# Patient Record
Sex: Male | Born: 1980 | Race: Black or African American | Hispanic: No | Marital: Married | Smoking: Current every day smoker
Health system: Southern US, Community
[De-identification: ages and names within clinical notes are randomized; demographics above are authoritative.]

## PROBLEM LIST (undated history)

## (undated) ENCOUNTER — Ambulatory Visit: Admission: EM | Payer: Managed Care, Other (non HMO) | Source: Home / Self Care

## (undated) DIAGNOSIS — I10 Essential (primary) hypertension: Secondary | ICD-10-CM

## (undated) DIAGNOSIS — E785 Hyperlipidemia, unspecified: Secondary | ICD-10-CM

---

## 2001-02-22 ENCOUNTER — Emergency Department (HOSPITAL_COMMUNITY): Admission: EM | Admit: 2001-02-22 | Discharge: 2001-02-23 | Payer: Self-pay | Admitting: Emergency Medicine

## 2001-03-16 ENCOUNTER — Emergency Department (HOSPITAL_COMMUNITY): Admission: EM | Admit: 2001-03-16 | Discharge: 2001-03-16 | Payer: Self-pay | Admitting: Emergency Medicine

## 2001-04-30 ENCOUNTER — Emergency Department (HOSPITAL_COMMUNITY): Admission: EM | Admit: 2001-04-30 | Discharge: 2001-05-01 | Payer: Self-pay | Admitting: Emergency Medicine

## 2001-05-15 ENCOUNTER — Emergency Department (HOSPITAL_COMMUNITY): Admission: EM | Admit: 2001-05-15 | Discharge: 2001-05-15 | Payer: Self-pay

## 2007-07-20 ENCOUNTER — Emergency Department (HOSPITAL_COMMUNITY): Admission: EM | Admit: 2007-07-20 | Discharge: 2007-07-20 | Payer: Self-pay | Admitting: Emergency Medicine

## 2008-06-23 ENCOUNTER — Emergency Department (HOSPITAL_COMMUNITY): Admission: EM | Admit: 2008-06-23 | Discharge: 2008-06-24 | Payer: Self-pay | Admitting: Emergency Medicine

## 2010-05-04 LAB — DIFFERENTIAL
Eosinophils Absolute: 0.2 10*3/uL (ref 0.0–0.7)
Lymphocytes Relative: 28 % (ref 12–46)
Lymphs Abs: 1.8 10*3/uL (ref 0.7–4.0)
Neutro Abs: 4 10*3/uL (ref 1.7–7.7)
Neutrophils Relative %: 62 % (ref 43–77)

## 2010-05-04 LAB — CBC
Platelets: 237 10*3/uL (ref 150–400)
RBC: 5.16 MIL/uL (ref 4.22–5.81)
WBC: 6.5 10*3/uL (ref 4.0–10.5)

## 2010-05-04 LAB — BASIC METABOLIC PANEL
BUN: 15 mg/dL (ref 6–23)
Creatinine, Ser: 1.35 mg/dL (ref 0.4–1.5)
GFR calc Af Amer: >60 mL/min (ref >60)
GFR calc non Af Amer: >60 mL/min (ref >60)

## 2010-10-21 LAB — BASIC METABOLIC PANEL
CO2: 27
Calcium: 9.9
Chloride: 109
GFR calc Af Amer: >60
Potassium: 4.1
Sodium: 141

## 2015-01-26 ENCOUNTER — Emergency Department (HOSPITAL_COMMUNITY)
Admission: EM | Admit: 2015-01-26 | Discharge: 2015-01-26 | Disposition: A | Discharge status: Home / Self Care | Payer: BLUE CROSS/BLUE SHIELD | Attending: Emergency Medicine | Admitting: Emergency Medicine

## 2015-01-26 ENCOUNTER — Encounter (HOSPITAL_COMMUNITY): Payer: Self-pay

## 2015-01-26 DIAGNOSIS — F17200 Nicotine dependence, unspecified, uncomplicated: Secondary | ICD-10-CM | POA: Diagnosis not present

## 2015-01-26 DIAGNOSIS — Z8639 Personal history of other endocrine, nutritional and metabolic disease: Secondary | ICD-10-CM | POA: Insufficient documentation

## 2015-01-26 DIAGNOSIS — K0889 Other specified disorders of teeth and supporting structures: Secondary | ICD-10-CM | POA: Insufficient documentation

## 2015-01-26 DIAGNOSIS — I10 Essential (primary) hypertension: Secondary | ICD-10-CM | POA: Insufficient documentation

## 2015-01-26 HISTORY — DX: Essential (primary) hypertension: I10

## 2015-01-26 HISTORY — DX: Hyperlipidemia, unspecified: E78.5

## 2015-01-26 MED ORDER — PENICILLIN V POTASSIUM 500 MG PO TABS: 500.0000 mg | ORAL_TABLET | Freq: Four times a day (QID) | ORAL | Status: AC

## 2015-01-26 MED ORDER — IBUPROFEN 800 MG PO TABS
800.0000 mg | ORAL_TABLET | Freq: Once | ORAL | Status: AC
  Administered 2015-01-26: 800 mg via ORAL
  Filled 2015-01-26: qty 1

## 2015-01-26 MED ORDER — PENICILLIN V POTASSIUM 250 MG PO TABS
500.0000 mg | ORAL_TABLET | Freq: Once | ORAL | Status: AC
  Administered 2015-01-26: 500 mg via ORAL
  Filled 2015-01-26: qty 2

## 2015-01-26 NOTE — ED Notes (Signed)
Pt states understanding of care given and follow up instructions 

## 2015-01-26 NOTE — ED Provider Notes (Signed)
CSN: [id number]     Arrival date & time 01/26/15  0049 History   First MD Initiated Contact with Patient 01/26/15 0059     Chief Complaint  Patient presents with  . Dental Pain     Patient is a 35 y.o. male presenting with tooth pain. The history is provided by the patient.  Dental Pain Location:  Lower Quality:  Dull Severity:  Moderate Duration:  3 days Timing:  Intermittent Progression:  Worsening Chronicity:  New Relieved by:  Nothing Worsened by:  Jaw movement Associated symptoms: no facial swelling and no fever     Past Medical History  Diagnosis Date  . Hypertension   . Hyperlipidemia    History reviewed. No pertinent past surgical history. No family history on file. Social History  Substance Use Topics  . Smoking status: Current Every Day Smoker  . Smokeless tobacco: None  . Alcohol Use: Yes    Review of Systems  Constitutional: Negative for fever.  HENT: Negative for facial swelling.   Gastrointestinal: Negative for vomiting.      Allergies  Review of patient's allergies indicates no known allergies.  Home Medications   Prior to Admission medications   Medication Sig Start Date End Date Taking? Authorizing Provider  lisinopril (PRINIVIL,ZESTRIL) 10 MG tablet Take 10 mg by mouth daily.   Yes Historical Provider, MD  penicillin v potassium (VEETID) 500 MG tablet Take 1 tablet (500 mg total) by mouth 4 (four) times daily. 01/26/15 02/02/15  Zadie Rhineonald Janica Eldred, MD   BP 144/73 mmHg  Pulse 87  Temp(Src) 98.4 F (36.9 C) (Oral)  Resp 16  Ht 6' (1.829 m)  Wt 122.471 kg  BMI 36.61 kg/m2  SpO2 100% Physical Exam CONSTITUTIONAL: Well developed/well nourished HEAD AND FACE: Normocephalic/atraumatic EYES: EOMI/PERRL ENMT: Mucous membranes moist.  No trismus.  No focal abscess noted. Tenderness to left lower molar, no abscess NECK: supple no meningeal signs LUNGS: Lungs are clear to auscultation bilaterally, no apparent distress ABDOMEN: soft, nontender, no  rebound or guarding NEURO: Pt is awake/alert, moves all extremitiesx4 EXTREMITIES:full ROM SKIN: warm, color normal  ED Course  Procedures   MDM   Final diagnoses:  Pain, dental    Nursing notes including past medical history and social history reviewed and considered in documentation     Zadie Rhineonald Rockie Schnoor, MD 01/26/15 0122

## 2015-01-26 NOTE — ED Notes (Signed)
Pt c/o pain to left lower tooth that is broken.  Pt states pain is worse when he lies down to sleep

## 2016-05-21 ENCOUNTER — Encounter (HOSPITAL_COMMUNITY): Payer: Self-pay

## 2016-05-21 ENCOUNTER — Emergency Department (HOSPITAL_COMMUNITY): Payer: BLUE CROSS/BLUE SHIELD

## 2016-05-21 ENCOUNTER — Emergency Department (HOSPITAL_COMMUNITY)
Admission: EM | Admit: 2016-05-21 | Discharge: 2016-05-21 | Disposition: A | Discharge status: Home / Self Care | Payer: BLUE CROSS/BLUE SHIELD | Attending: Emergency Medicine | Admitting: Emergency Medicine

## 2016-05-21 DIAGNOSIS — Z79899 Other long term (current) drug therapy: Secondary | ICD-10-CM | POA: Diagnosis not present

## 2016-05-21 DIAGNOSIS — S20362D Insect bite (nonvenomous) of left front wall of thorax, subsequent encounter: Secondary | ICD-10-CM | POA: Diagnosis not present

## 2016-05-21 DIAGNOSIS — R509 Fever, unspecified: Secondary | ICD-10-CM

## 2016-05-21 DIAGNOSIS — W57XXXD Bitten or stung by nonvenomous insect and other nonvenomous arthropods, subsequent encounter: Secondary | ICD-10-CM | POA: Diagnosis not present

## 2016-05-21 DIAGNOSIS — F17200 Nicotine dependence, unspecified, uncomplicated: Secondary | ICD-10-CM | POA: Insufficient documentation

## 2016-05-21 DIAGNOSIS — I10 Essential (primary) hypertension: Secondary | ICD-10-CM | POA: Insufficient documentation

## 2016-05-21 LAB — RAPID STREP SCREEN (MED CTR MEBANE ONLY): STREPTOCOCCUS, GROUP A SCREEN (DIRECT): NEGATIVE

## 2016-05-21 LAB — COMPREHENSIVE METABOLIC PANEL
ALK PHOS: 63 U/L (ref 38–126)
ALT: 51 U/L (ref 17–63)
AST: 41 U/L (ref 15–41)
Albumin: 3.9 g/dL (ref 3.5–5.0)
Anion gap: 7 (ref 5–15)
BILIRUBIN TOTAL: 0.5 mg/dL (ref 0.3–1.2)
BUN: 15 mg/dL (ref 6–20)
CALCIUM: 8.9 mg/dL (ref 8.9–10.3)
CO2: 26 mmol/L (ref 22–32)
CREATININE: 1.44 mg/dL — AB (ref 0.61–1.24)
Chloride: 104 mmol/L (ref 101–111)
GFR calc Af Amer: >60 mL/min (ref >60)
Glucose, Bld: 99 mg/dL (ref 65–99)
Potassium: 4.1 mmol/L (ref 3.5–5.1)
Sodium: 137 mmol/L (ref 135–145)
Total Protein: 6.8 g/dL (ref 6.5–8.1)

## 2016-05-21 LAB — CBC WITH DIFFERENTIAL/PLATELET
Basophils Absolute: 0 10*3/uL (ref 0.0–0.1)
Basophils Relative: 1 %
EOS PCT: 1 %
Eosinophils Absolute: 0.1 10*3/uL (ref 0.0–0.7)
HEMATOCRIT: 41.9 % (ref 39.0–52.0)
HEMOGLOBIN: 13.2 g/dL (ref 13.0–17.0)
LYMPHS ABS: 0.8 10*3/uL (ref 0.7–4.0)
LYMPHS PCT: 20 %
MCH: 22.9 pg — AB (ref 26.0–34.0)
MCHC: 31.5 g/dL (ref 30.0–36.0)
MCV: 72.7 fL — AB (ref 78.0–100.0)
Monocytes Absolute: 0.5 10*3/uL (ref 0.1–1.0)
Monocytes Relative: 12 %
NEUTROS PCT: 66 %
Neutro Abs: 2.5 10*3/uL (ref 1.7–7.7)
Platelets: 230 10*3/uL (ref 150–400)
RBC: 5.76 MIL/uL (ref 4.22–5.81)
RDW: 14.3 % (ref 11.5–15.5)
WBC: 3.8 10*3/uL — AB (ref 4.0–10.5)

## 2016-05-21 MED ORDER — DOXYCYCLINE HYCLATE 100 MG PO CAPS: 100.0000 mg | ORAL_CAPSULE | Freq: Two times a day (BID) | ORAL | 0 refills | Status: DC

## 2016-05-21 NOTE — Discharge Instructions (Signed)
Take the antibiotics as prescribed.  Follow up with your doctor. Return to the ED if you develop new or worsening symptoms. °

## 2016-05-21 NOTE — ED Provider Notes (Signed)
[facility] Provider Note   CSN: [id number] Arrival date & time: 05/21/16  0042     History   Chief Complaint Chief Complaint  Patient presents with  . Fever    HPI Allen Ramirez is a 36 y.o. male.  Patient presents with 2 day history of fever. States his wife checked his temperature yesterday because he felt warm and was 101. She taking Tylenol at home for the fever which has persisted until today. He denies any other associated symptoms. He was seen by his PCP last week for an abscess to his chest wall that was lanced. His been taking an unknown antibiotic twice daily since that he should be taking it 3 times a day according to his wife. It could be Keflex. He also states he was bitten by ticks 2 weeks ago while he was fishing. He denies any cough, runny nose or sore throat. He denies any nausea or vomiting. He did have one loose stool today. Denies abdominal pain, chest pain, shortness of breath. He does have intermittent headaches. He denies any body aches. He does not feel ill. He's been eating and drinking well. No other the country travel. No one else has been sick around him. Denies any rashes.   The history is provided by the patient.  Fever   Pertinent negatives include no chest pain, no vomiting, no congestion and no sore throat.    Past Medical History:  Diagnosis Date  . Hyperlipidemia   . Hypertension     There are no active problems to display for this patient.   History reviewed. No pertinent surgical history.     Home Medications    Prior to Admission medications   Medication Sig Start Date End Date Taking? Authorizing Provider  lisinopril (PRINIVIL,ZESTRIL) 10 MG tablet Take 10 mg by mouth daily.   Yes Historical Provider, MD    Family History No family history on file.  Social History Social History  Substance Use Topics  . Smoking status: Current Every Day Smoker  . Smokeless tobacco: Never Used  . Alcohol use Yes      Allergies   Patient has no known allergies.   Review of Systems Review of Systems  Constitutional: Positive for fatigue and fever. Negative for activity change and appetite change.  HENT: Negative for congestion, rhinorrhea, sore throat and trouble swallowing.   Respiratory: Negative for choking, chest tightness and shortness of breath.   Cardiovascular: Negative for chest pain.  Gastrointestinal: Negative for abdominal pain, nausea and vomiting.  Genitourinary: Negative for dysuria, hematuria and testicular pain.  Musculoskeletal: Positive for arthralgias and myalgias.  Skin: Positive for wound.  Neurological: Negative for dizziness, weakness and light-headedness.   all other systems are negative except as noted in the HPI and PMH.    Physical Exam Updated Vital Signs BP (!) 143/83 (BP Location: Right Arm)   Pulse 87   Temp 98.5 F (36.9 C) (Oral)   Resp 20   Ht 6' (1.829 m)   Wt 270 lb (122.5 kg)   SpO2 98%   BMI 36.62 kg/m   Physical Exam  Constitutional: He is oriented to person, place, and time. He appears well-developed and well-nourished. No distress.  HENT:  Head: Normocephalic and atraumatic.  Right Ear: External ear normal.  Left Ear: External ear normal.  Mouth/Throat: Oropharynx is clear and moist. No oropharyngeal exudate.  Eyes: Conjunctivae and EOM are normal. Pupils are equal, round, and reactive to light.  Neck: Normal range of  motion. Neck supple.  No meningismus.  Cardiovascular: Normal rate, regular rhythm, normal heart sounds and intact distal pulses.   No murmur heard. Pulmonary/Chest: Effort normal and breath sounds normal. No respiratory distress.  There is a healing abscess to left chest wall without fluctuance or significant cellulitis.  Abdominal: Soft. There is no tenderness. There is no rebound and no guarding.  Musculoskeletal: Normal range of motion. He exhibits no edema or tenderness.  Neurological: He is alert and oriented to  person, place, and time. No cranial nerve deficit. He exhibits normal muscle tone. Coordination normal.   5/5 strength throughout. CN 2-12 intact.Equal grip strength.   Skin: Skin is warm.  Psychiatric: He has a normal mood and affect. His behavior is normal.  Nursing note and vitals reviewed.    ED Treatments / Results  Labs (all labs ordered are listed, but only abnormal results are displayed) Labs Reviewed  CBC WITH DIFFERENTIAL/PLATELET - Abnormal; Notable for the following:       Result Value   WBC 3.8 (*)    MCV 72.7 (*)    MCH 22.9 (*)    All other components within normal limits  COMPREHENSIVE METABOLIC PANEL - Abnormal; Notable for the following:    Creatinine, Ser 1.44 (*)    All other components within normal limits  RAPID STREP SCREEN (NOT AT [facility])  CULTURE, GROUP A STREP ([facility])  B. BURGDORFI ANTIBODIES    EKG  EKG Interpretation None       Radiology Dg Chest 2 View  Result Date: 05/21/2016 CLINICAL DATA:  36 year old male with fever. EXAM: CHEST  2 VIEW COMPARISON:  None. FINDINGS: The heart size and mediastinal contours are within normal limits. Both lungs are clear. The visualized skeletal structures are unremarkable. IMPRESSION: No active cardiopulmonary disease. Electronically Signed   By: Elgie Collard M.D.   On: 05/21/2016 03:23    Procedures Procedures (including critical care time)  Medications Ordered in ED Medications - No data to display   Initial Impression / Assessment and Plan / ED Course  I have reviewed the triage vital signs and the nursing notes.  Pertinent labs & imaging results that were available during my care of the patient were reviewed by me and considered in my medical decision making (see chart for details).    Patient with 2 day history of fever. Recent drainage of chest wall abscess 1 week ago. Also reports tick bite 2 weeks ago. Chest wall appears to be hearing healing well without evidence of recurrent abscess or  cellulitis.  Patient appears well. Nontoxic and not septic appearing. Denies any cough, runny nose, sore throat, vomiting or diarrhea.  Labs show mild leukopenia. Chest x-ray and rapid strep are negative. Lyme titers sent.  Patient will be treated empirically for possible tick exposure with doxycycline. He appears well. Supportive care discussed at home. Return precautions discussed.  Final Clinical Impressions(s) / ED Diagnoses   Final diagnoses:  Febrile illness  Tick bite, subsequent encounter    New Prescriptions New Prescriptions   No medications on file     Glynn Octave, MD 05/21/16 [phone]

## 2016-05-21 NOTE — ED Notes (Signed)
Pt alert and oriented x 4. Stable gait. Pt given discharge papers/prescriptions. Pt told to stop by registration to complete any additional paperwork. Pt left the department with no further questions. 

## 2016-05-23 LAB — CULTURE, GROUP A STREP (THRC)

## 2016-05-23 LAB — B. BURGDORFI ANTIBODIES: B burgdorferi Ab IgG+IgM: <0.91 {ISR} (ref 0.00–0.90)

## 2020-02-15 ENCOUNTER — Emergency Department (HOSPITAL_COMMUNITY)
Admission: EM | Admit: 2020-02-15 | Discharge: 2020-02-16 | Disposition: A | Discharge status: Home / Self Care | Payer: Managed Care, Other (non HMO) | Attending: Emergency Medicine | Admitting: Emergency Medicine

## 2020-02-15 ENCOUNTER — Encounter (HOSPITAL_COMMUNITY): Payer: Self-pay

## 2020-02-15 ENCOUNTER — Emergency Department (HOSPITAL_COMMUNITY): Payer: Managed Care, Other (non HMO)

## 2020-02-15 ENCOUNTER — Other Ambulatory Visit: Payer: Self-pay

## 2020-02-15 DIAGNOSIS — R079 Chest pain, unspecified: Secondary | ICD-10-CM | POA: Diagnosis present

## 2020-02-15 DIAGNOSIS — Z79899 Other long term (current) drug therapy: Secondary | ICD-10-CM | POA: Diagnosis not present

## 2020-02-15 DIAGNOSIS — F17200 Nicotine dependence, unspecified, uncomplicated: Secondary | ICD-10-CM | POA: Insufficient documentation

## 2020-02-15 DIAGNOSIS — I10 Essential (primary) hypertension: Secondary | ICD-10-CM | POA: Insufficient documentation

## 2020-02-15 LAB — CBC
HCT: 42.8 % (ref 39.0–52.0)
Hemoglobin: 13.3 g/dL (ref 13.0–17.0)
MCH: 23.2 pg — ABNORMAL LOW (ref 26.0–34.0)
MCHC: 31.1 g/dL (ref 30.0–36.0)
MCV: 74.6 fL — ABNORMAL LOW (ref 80.0–100.0)
Platelets: 331 10*3/uL (ref 150–400)
RBC: 5.74 MIL/uL (ref 4.22–5.81)
RDW: 14.9 % (ref 11.5–15.5)
WBC: 7 10*3/uL (ref 4.0–10.5)
nRBC: 0 % (ref 0.0–0.2)

## 2020-02-15 LAB — BASIC METABOLIC PANEL
Anion gap: 10 (ref 5–15)
BUN: 18 mg/dL (ref 6–20)
CO2: 20 mmol/L — ABNORMAL LOW (ref 22–32)
Calcium: 9.2 mg/dL (ref 8.9–10.3)
Chloride: 108 mmol/L (ref 98–111)
Creatinine, Ser: 1.35 mg/dL — ABNORMAL HIGH (ref 0.61–1.24)
GFR, Estimated: >60 mL/min (ref >60)
Glucose, Bld: 91 mg/dL (ref 70–99)
Potassium: 4.5 mmol/L (ref 3.5–5.1)
Sodium: 138 mmol/L (ref 135–145)

## 2020-02-15 LAB — TROPONIN I (HIGH SENSITIVITY)
Troponin I (High Sensitivity): 7 ng/L (ref <18)
Troponin I (High Sensitivity): 7 ng/L (ref <18)

## 2020-02-15 NOTE — ED Triage Notes (Signed)
Pt presents with Left side chest pain x3 days. Pt taking lisinopril for HTN nut is still having high blood pressure

## 2020-02-16 LAB — D-DIMER, QUANTITATIVE: D-Dimer, Quant: <0.27 ug/mL-FEU (ref 0.00–0.50)

## 2020-02-16 NOTE — Discharge Instructions (Signed)
All cardiac tests today were normal. Keep an eye on BP at home-- check no more than twice a day, same time each day. Follow-up with your primary care doctor. Return here for new concerns.

## 2020-02-16 NOTE — ED Provider Notes (Signed)
[facility] EMERGENCY DEPARTMENT Provider Note   CSN: [id number] Arrival date & time: 02/15/20  1755     History Chief Complaint  Patient presents with  . Chest Pain    Allen Ramirez is a 40 y.o. male.  The history is provided by the patient and medical records.   40 y.o. M with hx of HLP and HTN, presenting to the ED for chest pain.  Patient states for the past 3 days he has had some left sided chest pain.  States it feels like a pinching sensation along lateral left chest.  Today he was laying on the floor painting and felt something pushing into his side.  States he stood up and was able to push on left side of chest and make the pain happen again.  He has not had any palpitations, SOB, dizziness, diaphoresis, nausea, vomiting.  States he has been shoveling snow recently from the driveway, no other strenuous activity.  He denies any recent travel, prolonged immobilization, surgery, leg swelling, calf pain, hx of DVT or PE.  Wife states they called his doctor today and was concerned about blood clot as he has recently recovered from covid-19.  No lingering URI symptoms.    Of note, BP elevated on arrival but has down trended to WNL by time of my evaluation.  Patient states he did feel anxious on arrival to ED.  Past Medical History:  Diagnosis Date  . Hyperlipidemia   . Hypertension     There are no problems to display for this patient.   No past surgical history on file.     No family history on file.  Social History   Tobacco Use  . Smoking status: Current Every Day Smoker  . Smokeless tobacco: Never Used  Substance Use Topics  . Alcohol use: Yes  . Drug use: No    Home Medications Prior to Admission medications   Medication Sig Start Date End Date Taking? Authorizing Provider  doxycycline (VIBRAMYCIN) 100 MG capsule Take 1 capsule (100 mg total) by mouth 2 (two) times daily. 05/21/16   Rancour, Jeannett Senior, MD  lisinopril  (PRINIVIL,ZESTRIL) 10 MG tablet Take 10 mg by mouth daily.    [provider]    Allergies    Patient has no known allergies.  Review of Systems   Review of Systems  Cardiovascular: Positive for chest pain.  All other systems reviewed and are negative.   Physical Exam Updated Vital Signs BP (!) 140/99   Pulse 82   Temp 98.6 F (37 C)   Resp 18   Ht 6' (1.829 m)   Wt 127 kg   SpO2 98%   BMI 37.97 kg/m   Physical Exam Vitals and nursing note reviewed.  Constitutional:      Appearance: He is well-developed and well-nourished.  HENT:     Head: Normocephalic and atraumatic.     Mouth/Throat:     Mouth: Oropharynx is clear and moist.  Eyes:     Extraocular Movements: EOM normal.     Conjunctiva/sclera: Conjunctivae normal.     Pupils: Pupils are equal, round, and reactive to light.  Cardiovascular:     Rate and Rhythm: Normal rate and regular rhythm.     Heart sounds: Normal heart sounds.  Pulmonary:     Effort: Pulmonary effort is normal.     Breath sounds: Normal breath sounds. No wheezing or rhonchi.  Chest:     Comments: No rash, signs of trauma, or  other abnormalities to the chest wall, no reproducible tenderness Abdominal:     General: Bowel sounds are normal.     Palpations: Abdomen is soft.  Musculoskeletal:        General: Normal range of motion.     Cervical back: Normal range of motion.  Skin:    General: Skin is warm and dry.  Neurological:     Mental Status: He is alert and oriented to person, place, and time.  Psychiatric:        Mood and Affect: Mood and affect normal.     ED Results / Procedures / Treatments   Labs (all labs ordered are listed, but only abnormal results are displayed) Labs Reviewed  BASIC METABOLIC PANEL - Abnormal; Notable for the following components:      Result Value   CO2 20 (*)    Creatinine, Ser 1.35 (*)    All other components within normal limits  CBC - Abnormal; Notable for the following components:    MCV 74.6 (*)    MCH 23.2 (*)    All other components within normal limits  D-DIMER, QUANTITATIVE (NOT AT [facility])  TROPONIN I (HIGH SENSITIVITY)  TROPONIN I (HIGH SENSITIVITY)    EKG EKG Interpretation  Date/Time:  Saturday February 15 2020 18:32:06 EST Ventricular Rate:  108 PR Interval:  162 QRS Duration: 80 QT Interval:  332 QTC Calculation: 444 R Axis:   33 Text Interpretation: Sinus tachycardia Nonspecific T wave abnormality Abnormal ECG No old tracing to compare Confirmed by Marily Memos [phone]) on 02/15/2020 11:04:22 PM   Radiology DG Chest 2 View  Result Date: 02/15/2020 CLINICAL DATA:  Pain radiating from left breast 2 umbilicus, hypertension EXAM: CHEST - 2 VIEW COMPARISON:  05/21/2016 FINDINGS: The heart size and mediastinal contours are within normal limits. Both lungs are clear. The visualized skeletal structures are unremarkable. IMPRESSION: No active cardiopulmonary disease. Electronically Signed   By: Sharlet Salina M.D.   On: 02/15/2020 19:18    Procedures Procedures (including critical care time)  Medications Ordered in ED Medications - No data to display  ED Course  I have reviewed the triage vital signs and the nursing notes.  Pertinent labs & imaging results that were available during my care of the patient were reviewed by me and considered in my medical decision making (see chart for details).    MDM Rules/Calculators/A&P  40 y.o. M here with 3 days of left sided chest pain.  Has been shoveling snow and laid on the floor painting today and had increased pain.  States he pushed on his chest and reproduced pain so got concerned.  Wife called PCP who apparent expressed concern for PE given he recently recovered from covid.  He is afebrile, non-toxic, NAD.  He does not have any rash or other abnormalities to left chest, no reproducible pain.  He denies any SOB or pleuritic type pain.  Initially tachycardic and hypertensive but normalized by time of my  evaluation without intervention.  EKG non-ischemic.  Labs reassuring including trop x2, SrCr at his baseline when compared with prior.  CXR clear.  Symptoms sound quite atypical, low suspicion for ACS.  Wife has continued concern for blood clot but symptoms are not consistent with such.  D-dimer was sent and is negative.  She is satisfied with this.  Likely chest wall pain/MSK.  Will d/c home with symptomatic care and close PCP follow-up.  Will monitor BP at home with cuff he has, report to PCP if  abnormalities/fluctuations.  Return here for new concerns.  Final Clinical Impression(s) / ED Diagnoses Final diagnoses:  Chest pain in adult    Rx / DC Orders ED Discharge Orders    None       Garlon Hatchet, PA-C 02/16/20 0143    Mesner, Barbara Cower, MD 02/16/20 [phone]

## 2020-06-24 ENCOUNTER — Other Ambulatory Visit: Payer: Self-pay

## 2020-06-24 ENCOUNTER — Ambulatory Visit
Admission: EM | Admit: 2020-06-24 | Discharge: 2020-06-24 | Disposition: A | Discharge status: Home / Self Care | Payer: Managed Care, Other (non HMO) | Attending: Family Medicine | Admitting: Family Medicine

## 2020-06-24 DIAGNOSIS — K0889 Other specified disorders of teeth and supporting structures: Secondary | ICD-10-CM

## 2020-06-24 DIAGNOSIS — K047 Periapical abscess without sinus: Secondary | ICD-10-CM

## 2020-06-24 MED ORDER — TRAMADOL HCL 50 MG PO TABS: 50.0000 mg | ORAL_TABLET | Freq: Four times a day (QID) | ORAL | 0 refills | Status: DC | PRN

## 2020-06-24 MED ORDER — CLINDAMYCIN HCL 300 MG PO CAPS
300.0000 mg | ORAL_CAPSULE | Freq: Three times a day (TID) | ORAL | 0 refills | Status: AC
Start: 2020-06-24 — End: 2020-07-01

## 2020-06-24 NOTE — ED Triage Notes (Signed)
Triaged by provider  

## 2020-06-24 NOTE — ED Provider Notes (Signed)
[facility]    CSN: [id number] Arrival date & time: 06/24/20  1650      History   Chief Complaint No chief complaint on file.   HPI Allen Ramirez is a 40 y.o. male.   Reports left lower jaw pain and reports that he has had an abscessed tooth in this same place previously. Reports that he has taken ibuprofen and tylenol for this with little relief. Has not taken blood pressure medications in a few days. Reports that he was afraid to take blood pressure medications with pain relievers. Denies headache, cough, SOB, nausea, vomiting, diarrhea, rash, fever, other symptoms.  ROS per HPI   The history is provided by the patient.    Past Medical History:  Diagnosis Date  . Hyperlipidemia   . Hypertension     There are no problems to display for this patient.   No past surgical history on file.     Home Medications    Prior to Admission medications   Medication Sig Start Date End Date Taking? Authorizing Provider  clindamycin (CLEOCIN) 300 MG capsule Take 1 capsule (300 mg total) by mouth 3 (three) times daily for 7 days. 06/24/20 07/01/20 Yes Moshe Cipro, NP  traMADol (ULTRAM) 50 MG tablet Take 1 tablet (50 mg total) by mouth every 6 (six) hours as needed. 06/24/20  Yes Moshe Cipro, NP  aspirin EC 81 MG tablet Take 81 mg by mouth every 6 (six) hours as needed for mild pain. Swallow whole.    [provider]  atorvastatin (LIPITOR) 10 MG tablet Take 10 mg by mouth at bedtime. 11/21/19   [provider]  lisinopril (ZESTRIL) 30 MG tablet Take 30 mg by mouth daily.    [provider]  vitamin C (ASCORBIC ACID) 500 MG tablet Take 500 mg by mouth daily.    [provider]    Family History No family history on file.  Social History Social History   Tobacco Use  . Smoking status: Current Every Day Smoker  . Smokeless tobacco: Never Used  Substance Use Topics  . Alcohol use: Yes  . Drug use: No      Allergies   Patient has no known allergies.   Review of Systems Review of Systems   Physical Exam Triage Vital Signs ED Triage Vitals [06/24/20 1717]  Enc Vitals Group     BP (!) 192/136     Pulse Rate 86     Resp 17     Temp 98.7 F (37.1 C)     Temp Source Oral     SpO2 95 %     Weight      Height      Head Circumference      Peak Flow      Pain Score      Pain Loc      Pain Edu?      Excl. in GC?    No data found.  Updated Vital Signs BP (!) 192/136 (BP Location: Right Arm) Comment: Pt did not take his BP meds for a few days  Pulse 86   Temp 98.7 F (37.1 C) (Oral)   Resp 17   SpO2 95%   Visual Acuity Right Eye Distance:   Left Eye Distance:   Bilateral Distance:    Right Eye Near:   Left Eye Near:    Bilateral Near:     Physical Exam Vitals and nursing note reviewed.  Constitutional:  General: He is not in acute distress.    Appearance: Normal appearance. He is well-developed. He is not ill-appearing.  HENT:     Head: Normocephalic and atraumatic.     Nose: Nose normal.     Mouth/Throat:     Mouth: Mucous membranes are moist.     Pharynx: Oropharynx is clear.     Comments: Erythema to left upper jaw, no draining, no bleeding, no fluctuant areas noted Eyes:     Extraocular Movements: Extraocular movements intact.     Conjunctiva/sclera: Conjunctivae normal.     Pupils: Pupils are equal, round, and reactive to light.  Cardiovascular:     Rate and Rhythm: Normal rate and regular rhythm.  Pulmonary:     Effort: Pulmonary effort is normal. No respiratory distress.  Musculoskeletal:        General: Normal range of motion.     Cervical back: Normal range of motion and neck supple.  Skin:    General: Skin is warm and dry.     Capillary Refill: Capillary refill takes less than 2 seconds.  Neurological:     General: No focal deficit present.     Mental Status: He is alert and oriented to person, place, and time.  Psychiatric:         Mood and Affect: Mood normal.        Behavior: Behavior normal.        Thought Content: Thought content normal.      UC Treatments / Results  Labs (all labs ordered are listed, but only abnormal results are displayed) Labs Reviewed - No data to display  EKG   Radiology No results found.  Procedures Procedures (including critical care time)  Medications Ordered in UC Medications - No data to display  Initial Impression / Assessment and Plan / UC Course  I have reviewed the triage vital signs and the nursing notes.  Pertinent labs & imaging results that were available during my care of the patient were reviewed by me and considered in my medical decision making (see chart for details).    Dental Pain Dental Infection  Prescribed Clindamycin TID for 7 days May take ibuprofen and tylenol for pain Prescribed tramadol prn breakthrough pain Follow up with dentistry   Final Clinical Impressions(s) / UC Diagnoses   Final diagnoses:  Dental infection  Pain, dental     Discharge Instructions     I have sent in Clindamycin for you to take three times per day for 7 days  I have sent in tramadol for you to take for breakthrough pain, 1 tablet every 6 hours as needed  May take 800 mg ibuprofen with 1000 mg of Tylenol.  Do not exceed 4000 mg of Tylenol in 24 hours.  Follow up with this office or with primary care if symptoms are persisting.  Follow up in the ER for high fever, trouble swallowing, trouble breathing, other concerning symptoms.     ED Prescriptions    Medication Sig Dispense Auth. Provider   clindamycin (CLEOCIN) 300 MG capsule Take 1 capsule (300 mg total) by mouth 3 (three) times daily for 7 days. 21 capsule Moshe Cipro, NP   traMADol (ULTRAM) 50 MG tablet Take 1 tablet (50 mg total) by mouth every 6 (six) hours as needed. 15 tablet Moshe Cipro, NP     I have reviewed the PDMP during this encounter.   Moshe Cipro,  NP 06/28/20 1607

## 2020-06-24 NOTE — Discharge Instructions (Signed)
I have sent in Clindamycin for you to take three times per day for 7 days  I have sent in tramadol for you to take for breakthrough pain, 1 tablet every 6 hours as needed  May take 800 mg ibuprofen with 1000 mg of Tylenol.  Do not exceed 4000 mg of Tylenol in 24 hours.  Follow up with this office or with primary care if symptoms are persisting.  Follow up in the ER for high fever, trouble swallowing, trouble breathing, other concerning symptoms.

## 2021-01-13 ENCOUNTER — Emergency Department (HOSPITAL_COMMUNITY)
Admission: EM | Admit: 2021-01-13 | Discharge: 2021-01-13 | Disposition: A | Discharge status: Home / Self Care | Payer: Managed Care, Other (non HMO) | Attending: Emergency Medicine | Admitting: Emergency Medicine

## 2021-01-13 ENCOUNTER — Encounter (HOSPITAL_COMMUNITY): Payer: Self-pay

## 2021-01-13 ENCOUNTER — Emergency Department (HOSPITAL_COMMUNITY): Payer: Managed Care, Other (non HMO)

## 2021-01-13 DIAGNOSIS — F17210 Nicotine dependence, cigarettes, uncomplicated: Secondary | ICD-10-CM | POA: Diagnosis not present

## 2021-01-13 DIAGNOSIS — I10 Essential (primary) hypertension: Secondary | ICD-10-CM | POA: Diagnosis not present

## 2021-01-13 DIAGNOSIS — Z79899 Other long term (current) drug therapy: Secondary | ICD-10-CM | POA: Insufficient documentation

## 2021-01-13 LAB — BASIC METABOLIC PANEL
Anion gap: 7 (ref 5–15)
BUN: 14 mg/dL (ref 6–20)
CO2: 26 mmol/L (ref 22–32)
Calcium: 8.8 mg/dL — ABNORMAL LOW (ref 8.9–10.3)
Chloride: 104 mmol/L (ref 98–111)
Creatinine, Ser: 1.57 mg/dL — ABNORMAL HIGH (ref 0.61–1.24)
GFR, Estimated: 56 mL/min — ABNORMAL LOW (ref >60)
Glucose, Bld: 109 mg/dL — ABNORMAL HIGH (ref 70–99)
Potassium: 3.7 mmol/L (ref 3.5–5.1)
Sodium: 137 mmol/L (ref 135–145)

## 2021-01-13 LAB — CBC
HCT: 41.7 % (ref 39.0–52.0)
Hemoglobin: 12.8 g/dL — ABNORMAL LOW (ref 13.0–17.0)
MCH: 23.1 pg — ABNORMAL LOW (ref 26.0–34.0)
MCHC: 30.7 g/dL (ref 30.0–36.0)
MCV: 75.1 fL — ABNORMAL LOW (ref 80.0–100.0)
Platelets: 318 10*3/uL (ref 150–400)
RBC: 5.55 MIL/uL (ref 4.22–5.81)
RDW: 14.7 % (ref 11.5–15.5)
WBC: 8.2 10*3/uL (ref 4.0–10.5)
nRBC: 0 % (ref 0.0–0.2)

## 2021-01-13 MED ORDER — LABETALOL HCL 5 MG/ML IV SOLN
10.0000 mg | Freq: Once | INTRAVENOUS | Status: AC
  Administered 2021-01-13: 19:00:00 10 mg via INTRAVENOUS
  Filled 2021-01-13: qty 4

## 2021-01-13 MED ORDER — LISINOPRIL 40 MG PO TABS: 40.0000 mg | ORAL_TABLET | Freq: Every day | ORAL | 1 refills | Status: AC

## 2021-01-13 MED ORDER — HYDROXYZINE HCL 25 MG PO TABS
25.0000 mg | ORAL_TABLET | Freq: Once | ORAL | Status: AC
  Administered 2021-01-13: 19:00:00 25 mg via ORAL
  Filled 2021-01-13: qty 1

## 2021-01-13 MED ORDER — METOPROLOL TARTRATE 25 MG PO TABS: 25.0000 mg | ORAL_TABLET | Freq: Two times a day (BID) | ORAL | 0 refills | Status: AC

## 2021-01-13 NOTE — ED Triage Notes (Signed)
For 2 days ive had ringing in my ears and BP readings are high at home, skin warm and dry, reports pressure in head as well.

## 2021-01-13 NOTE — Discharge Instructions (Signed)
Increase your lisinopril to 40 mg/day.  Start taking the metoprolol blood pressure medication 25 mg twice a day.  It is important to take your blood pressure medications regularly.  Follow-up with your doctor to have that rechecked

## 2021-01-13 NOTE — ED Provider Notes (Signed)
[facility] EMERGENCY DEPARTMENT Provider Note   CSN: [id number] Arrival date & time: 01/13/21  1758     History Chief complaint: Hypertension  Allen Ramirez is a 40 y.o. male.  HPI  Patient presents to the ER for evaluation of high blood pressure.  Patient states for the last couple days he has had some ringing in his ears.  He has had a slight headache.  He has been checking his blood pressure at home and it has been very high.  Patient states he does not regularly take his blood pressure medications but has started retaking them recently.  Denies any chest pain.  He is not have any shortness of breath.  Denies any abdominal pain vomiting or diarrhea.  He denies any difficulty with his speech.  No trouble with the balance.  No numbness or weakness.  Past Medical History:  Diagnosis Date   Hyperlipidemia    Hypertension     There are no problems to display for this patient.   History reviewed. No pertinent surgical history.     History reviewed. No pertinent family history.  Social History   Tobacco Use   Smoking status: Every Day    Types: Cigarettes   Smokeless tobacco: Never  Substance Use Topics   Alcohol use: Yes   Drug use: No    Home Medications Prior to Admission medications   Medication Sig Start Date End Date Taking? Authorizing Provider  acetaminophen (TYLENOL) 500 MG tablet Take 1,000 mg by mouth every 6 (six) hours as needed.   Yes [provider]  atorvastatin (LIPITOR) 10 MG tablet Take 10 mg by mouth at bedtime. 11/21/19  Yes [provider]  metoprolol tartrate (LOPRESSOR) 25 MG tablet Take 1 tablet (25 mg total) by mouth 2 (two) times daily. 01/13/21  Yes Linwood Dibbles, MD  aspirin EC 81 MG tablet Take 81 mg by mouth every 6 (six) hours as needed for mild pain. Swallow whole. Patient not taking: Reported on 01/13/2021    [provider]  lisinopril (ZESTRIL) 40 MG tablet Take 1 tablet (40 mg total) by mouth daily.  01/13/21   Linwood Dibbles, MD  traMADol (ULTRAM) 50 MG tablet Take 1 tablet (50 mg total) by mouth every 6 (six) hours as needed. Patient not taking: Reported on 01/13/2021 06/24/20   Moshe Cipro, NP  vitamin C (ASCORBIC ACID) 500 MG tablet Take 500 mg by mouth daily. Patient not taking: Reported on 01/13/2021    [provider]    Allergies    Patient has no known allergies.  Review of Systems   Review of Systems  All other systems reviewed and are negative.  Physical Exam Updated Vital Signs BP (!) 144/78    Pulse 96    Temp 97.8 F (36.6 C) (Oral)    Resp (!) 23    Ht 1.829 m (6')    Wt 127 kg    SpO2 98%    BMI 37.97 kg/m   Physical Exam Vitals and nursing note reviewed.  Constitutional:      General: He is not in acute distress.    Appearance: He is well-developed.  HENT:     Head: Normocephalic and atraumatic.     Right Ear: External ear normal.     Left Ear: External ear normal.  Eyes:     General: No scleral icterus.       Right eye: No discharge.        Left eye: No discharge.  Conjunctiva/sclera: Conjunctivae normal.  Neck:     Trachea: No tracheal deviation.  Cardiovascular:     Rate and Rhythm: Normal rate and regular rhythm.  Pulmonary:     Effort: Pulmonary effort is normal. No respiratory distress.     Breath sounds: Normal breath sounds. No stridor. No wheezing or rales.  Abdominal:     General: Bowel sounds are normal. There is no distension.     Palpations: Abdomen is soft.     Tenderness: There is no abdominal tenderness. There is no guarding or rebound.  Musculoskeletal:        General: No tenderness or deformity.     Cervical back: Neck supple.  Skin:    General: Skin is warm and dry.     Findings: No rash.  Neurological:     General: No focal deficit present.     Mental Status: He is alert.     Cranial Nerves: No cranial nerve deficit (no facial droop, extraocular movements intact, no slurred speech).     Sensory: No sensory  deficit.     Motor: No abnormal muscle tone or seizure activity.     Coordination: Coordination normal.  Psychiatric:        Mood and Affect: Mood normal.    ED Results / Procedures / Treatments   Labs (all labs ordered are listed, but only abnormal results are displayed) Labs Reviewed  CBC - Abnormal; Notable for the following components:      Result Value   Hemoglobin 12.8 (*)    MCV 75.1 (*)    MCH 23.1 (*)    All other components within normal limits  BASIC METABOLIC PANEL - Abnormal; Notable for the following components:   Glucose, Bld 109 (*)    Creatinine, Ser 1.57 (*)    Calcium 8.8 (*)    GFR, Estimated 56 (*)    All other components within normal limits    EKG EKG Interpretation  Date/Time:  Wednesday January 13 2021 18:30:55 EST Ventricular Rate:  104 PR Interval:  148 QRS Duration: 83 QT Interval:  344 QTC Calculation: 453 R Axis:   32 Text Interpretation: Age not entered, assumed to be  40 years old for purpose of ECG interpretation Sinus tachycardia Abnormal R-wave progression, early transition Borderline T abnormalities, lateral leads Baseline wander in lead(s) V1 V2 No significant change since last tracing Confirmed by Linwood Dibbles [phone]) on 01/13/2021 6:46:49 PM  Radiology DG Chest Portable 1 View  Result Date: 01/13/2021 CLINICAL DATA:  New onset hypertension for 2 days, initial encounter EXAM: PORTABLE CHEST 1 VIEW COMPARISON:  02/15/2020 FINDINGS: Cardiac shadow is stable. Lungs are well aerated bilaterally. No bony abnormality is seen. IMPRESSION: No active disease. Electronically Signed   By: Alcide Clever M.D.   On: 01/13/2021 19:23    Procedures Procedures   Medications Ordered in ED Medications  labetalol (NORMODYNE) injection 10 mg (10 mg Intravenous Given 01/13/21 1850)  hydrOXYzine (ATARAX) tablet 25 mg (25 mg Oral Given 01/13/21 1917)    ED Course  I have reviewed the triage vital signs and the nursing notes.  Pertinent labs & imaging  results that were available during my care of the patient were reviewed by me and considered in my medical decision making (see chart for details).  Clinical Course as of 01/13/21 1947  Wed Jan 13, 2021  1926 CBC shows slightly decreased hemoglobin.  Similar to previous values.  Metabolic panel does show elevated creatinine.  Suspect this is related  to patient's noncompliance with his hypertension agents [JK]  1927 Chest x-ray without acute findings. [JK]  1927 Pressure has improved with treatment [JK]    Clinical Course User Index [JK] Linwood Dibbles, MD   MDM Rules/Calculators/A&P                         Patient presented to the ED for evaluation of hypertension.  Patient admits to not being compliant with his medications.  He has not seen his primary care doctor in a while.  Patient was notably hypertensive on arrival.  Fortunately no focal neurologic symptoms to suggest acute stroke.  No chest pain or shortness of breath.  GU not suggestive of ischemia.  No signs of CHF on chest x-ray.  Patient was given a dose of labetalol.  His blood pressure has improved.  I will have the patient increase his lisinopril to 40 mg a day.  I will also add beta-blocker labetalol 25 mg twice daily and recommend close follow-up with his primary care doctor.    Final Clinical Impression(s) / ED Diagnoses Final diagnoses:  Hypertension, unspecified type    Rx / DC Orders ED Discharge Orders          Ordered    lisinopril (ZESTRIL) 40 MG tablet  Daily        01/13/21 1941    metoprolol tartrate (LOPRESSOR) 25 MG tablet  2 times daily        01/13/21 1941             Linwood Dibbles, MD 01/13/21 1947

## 2021-01-15 ENCOUNTER — Emergency Department (HOSPITAL_COMMUNITY)
Admission: EM | Admit: 2021-01-15 | Discharge: 2021-01-15 | Disposition: A | Discharge status: Home / Self Care | Payer: Managed Care, Other (non HMO) | Attending: Emergency Medicine | Admitting: Emergency Medicine

## 2021-01-15 DIAGNOSIS — R030 Elevated blood-pressure reading, without diagnosis of hypertension: Secondary | ICD-10-CM | POA: Insufficient documentation

## 2021-01-15 DIAGNOSIS — Z5321 Procedure and treatment not carried out due to patient leaving prior to being seen by health care provider: Secondary | ICD-10-CM | POA: Diagnosis not present

## 2021-01-15 NOTE — ED Triage Notes (Signed)
Patient decided to return later if he had symptoms

## 2021-04-02 ENCOUNTER — Other Ambulatory Visit: Payer: Self-pay

## 2021-12-19 ENCOUNTER — Encounter (HOSPITAL_COMMUNITY): Payer: Self-pay

## 2021-12-19 ENCOUNTER — Emergency Department (HOSPITAL_COMMUNITY)
Admit: 2021-12-19 | Discharge: 2021-12-19 | Disposition: A | Discharge status: Home / Self Care | Payer: Commercial Managed Care - PPO | Attending: Emergency Medicine | Admitting: Emergency Medicine

## 2021-12-19 ENCOUNTER — Emergency Department (HOSPITAL_COMMUNITY)
Admission: EM | Admit: 2021-12-19 | Discharge: 2021-12-19 | Disposition: A | Discharge status: Home / Self Care | Payer: Commercial Managed Care - PPO | Attending: Emergency Medicine | Admitting: Emergency Medicine

## 2021-12-19 ENCOUNTER — Other Ambulatory Visit: Payer: Self-pay

## 2021-12-19 DIAGNOSIS — I10 Essential (primary) hypertension: Secondary | ICD-10-CM | POA: Diagnosis not present

## 2021-12-19 DIAGNOSIS — M79652 Pain in left thigh: Secondary | ICD-10-CM | POA: Diagnosis present

## 2021-12-19 DIAGNOSIS — M79605 Pain in left leg: Secondary | ICD-10-CM

## 2021-12-19 DIAGNOSIS — Z79899 Other long term (current) drug therapy: Secondary | ICD-10-CM | POA: Diagnosis not present

## 2021-12-19 DIAGNOSIS — Z7982 Long term (current) use of aspirin: Secondary | ICD-10-CM | POA: Insufficient documentation

## 2021-12-19 MED ORDER — APIXABAN 5 MG PO TABS
5.0000 mg | ORAL_TABLET | Freq: Once | ORAL | Status: AC
  Administered 2021-12-19: 5 mg via ORAL
  Filled 2021-12-19: qty 1

## 2021-12-19 MED ORDER — DOXYCYCLINE HYCLATE 100 MG PO CAPS: 100.0000 mg | ORAL_CAPSULE | Freq: Two times a day (BID) | ORAL | 0 refills | Status: DC

## 2021-12-19 MED ORDER — DOXYCYCLINE HYCLATE 100 MG PO TABS
100.0000 mg | ORAL_TABLET | Freq: Once | ORAL | Status: AC
Start: 2021-12-19 — End: 2021-12-19
  Administered 2021-12-19: 100 mg via ORAL
  Filled 2021-12-19: qty 1

## 2021-12-19 NOTE — ED Triage Notes (Signed)
Pt arrived via POV from home c/o new knot/abscess on inside of left upper leg. Hard palpable knot noted on assessment during triage and skin is warm to touch. Pt in NAD.

## 2021-12-19 NOTE — ED Provider Notes (Signed)
This patient presented to the emergency department last night for a painful knot in medial aspect of distal left thigh.  He was started on doxycycline for empiric treatment of cellulitis.  He was ordered a DVT study which was unavailable at the time.  He returns to the emergency department this morning and did undergo this DVT study.  This DVT study was negative for DVT.  It did show an inguinal lymph node which is favored to be reactive in the setting of distal cellulitis.  Patient was informed of DVT study results and advised to continue taking his antibiotics.   Gloris Manchester, MD 12/19/21 1139

## 2021-12-19 NOTE — Discharge Instructions (Signed)
Return tomorrow for vascular ultrasound to make sure there is not a blood clot causing the pain and swelling.  If there is no blood clot, continue using warm compresses and taking the antibiotic.  If the ultrasound does show a blood clot, you will also be given a prescription for a blood thinner.  You may take ibuprofen, naproxen, or acetaminophen as needed for pain.  Please note that if you are started on a blood thinner, you should not take ibuprofen or naproxen while you are taking the blood thinner.

## 2021-12-19 NOTE — ED Provider Notes (Signed)
[facility] EMERGENCY DEPARTMENT Provider Note   CSN: [id number] Arrival date & time: 12/19/21  0019     History  Chief Complaint  Patient presents with   Abscess    Allen Ramirez is a 41 y.o. male.  The history is provided by the patient.  Abscess He has history of hypertension, hyperlipidemia and comes in because of a painful knot in the medial aspect of his distal left thigh.  He denies any history of trauma.  He is concerned that it might be an abscess or a blood clot.  He denies any trauma.  He denies any fever or chills.   Home Medications Prior to Admission medications   Medication Sig Start Date End Date Taking? Authorizing Provider  acetaminophen (TYLENOL) 500 MG tablet Take 1,000 mg by mouth every 6 (six) hours as needed.    [provider]  aspirin EC 81 MG tablet Take 81 mg by mouth every 6 (six) hours as needed for mild pain. Swallow whole. Patient not taking: Reported on 01/13/2021    [provider]  atorvastatin (LIPITOR) 10 MG tablet Take 10 mg by mouth at bedtime. 11/21/19   [provider]  lisinopril (ZESTRIL) 40 MG tablet Take 1 tablet (40 mg total) by mouth daily. 01/13/21   Linwood Dibbles, MD  metoprolol tartrate (LOPRESSOR) 25 MG tablet Take 1 tablet (25 mg total) by mouth 2 (two) times daily. 01/13/21   Linwood Dibbles, MD  traMADol (ULTRAM) 50 MG tablet Take 1 tablet (50 mg total) by mouth every 6 (six) hours as needed. Patient not taking: Reported on 01/13/2021 06/24/20   Moshe Cipro, NP  vitamin C (ASCORBIC ACID) 500 MG tablet Take 500 mg by mouth daily. Patient not taking: Reported on 01/13/2021    [provider]      Allergies    Patient has no known allergies.    Review of Systems   Review of Systems  All other systems reviewed and are negative.   Physical Exam Updated Vital Signs BP (!) 165/90 (BP Location: Left Arm)   Pulse 82   Temp 98.6 F (37 C) (Oral)   Resp 18   Ht 6' (1.829 m)   Wt  (!) 148.8 kg   SpO2 100%   BMI 44.48 kg/m  Physical Exam Vitals and nursing note reviewed.   41 year old male, resting comfortably and in no acute distress. Vital signs are significant for elevated blood pressure. Oxygen saturation is 100%, which is normal. Head is normocephalic and atraumatic. PERRLA, EOMI. Oropharynx is clear. Neck is nontender and supple without adenopathy or JVD. Back is nontender and there is no CVA tenderness. Lungs are clear without rales, wheezes, or rhonchi. Chest is nontender. Heart has regular rate and rhythm without murmur. Abdomen is soft, flat, nontender. Extremities:  There is a tender, indurated area at the distal left thigh medially measuring 5 cm x 5 cm.  There is no overlying erythema or warmth and there are no lymphangitic streaks.  There are no palpable cords. Skin is warm and dry without rash. Neurologic: Mental status is normal, cranial nerves are intact, moves all extremities equally.  ED Results / Procedures / Treatments    Procedures Ultrasound ED Soft Tissue  Date/Time: 12/19/2021 2:55 AM  Performed by: Dione Booze, MD Authorized by: Dione Booze, MD   Procedure details:    Indications: localization of abscess and evaluate for cellulitis     Transverse view:  Visualized   Longitudinal view:  Visualized   Images: archived   Location:    Location: lower extremity     Side:  Left Findings:     no abscess present    cellulitis present     Medications Ordered in ED Medications  apixaban (ELIQUIS) tablet 5 mg (has no administration in time range)  doxycycline (VIBRA-TABS) tablet 100 mg (has no administration in time range)    ED Course/ Medical Decision Making/ A&P                           Medical Decision Making  Tender nodule in the distal left thigh.  Possible hematoma, possible abscess, possible early cellulitis.  I evaluated the area with a limited bedside ultrasound and found no abscess cavity but some cobblestoning  suggestive of cellulitis.  To evaluate for possible DVT, I have ordered an outpatient venous ultrasound to be done later today and I have ordered an initial dose of apixaban.  To treat cellulitis, I have ordered a dose of doxycycline and I am discharging him with a prescription for doxycycline.   He is advised to use warm compresses and use over-the-counter analgesics as needed for pain.  Final Clinical Impression(s) / ED Diagnoses Final diagnoses:  Pain in left thigh  Elevated blood pressure reading with diagnosis of hypertension    Rx / DC Orders ED Discharge Orders          Ordered    US Venous Img Lower Unilateral Left        12/19/21 0245    doxycycline (VIBRAMYCIN) 100 MG capsule  2 times daily        12/19/21 0245              Dione Booze, MD 12/19/21 [phone]

## 2022-07-21 IMAGING — DX DG CHEST 2V
2 series · 2 of 2 positions shown · non-contrast
Comparison: 05/21/2016

CLINICAL DATA: Pain radiating from left breast 2 umbilicus,
hypertension

EXAM:
CHEST - 2 VIEW

[chest pa]
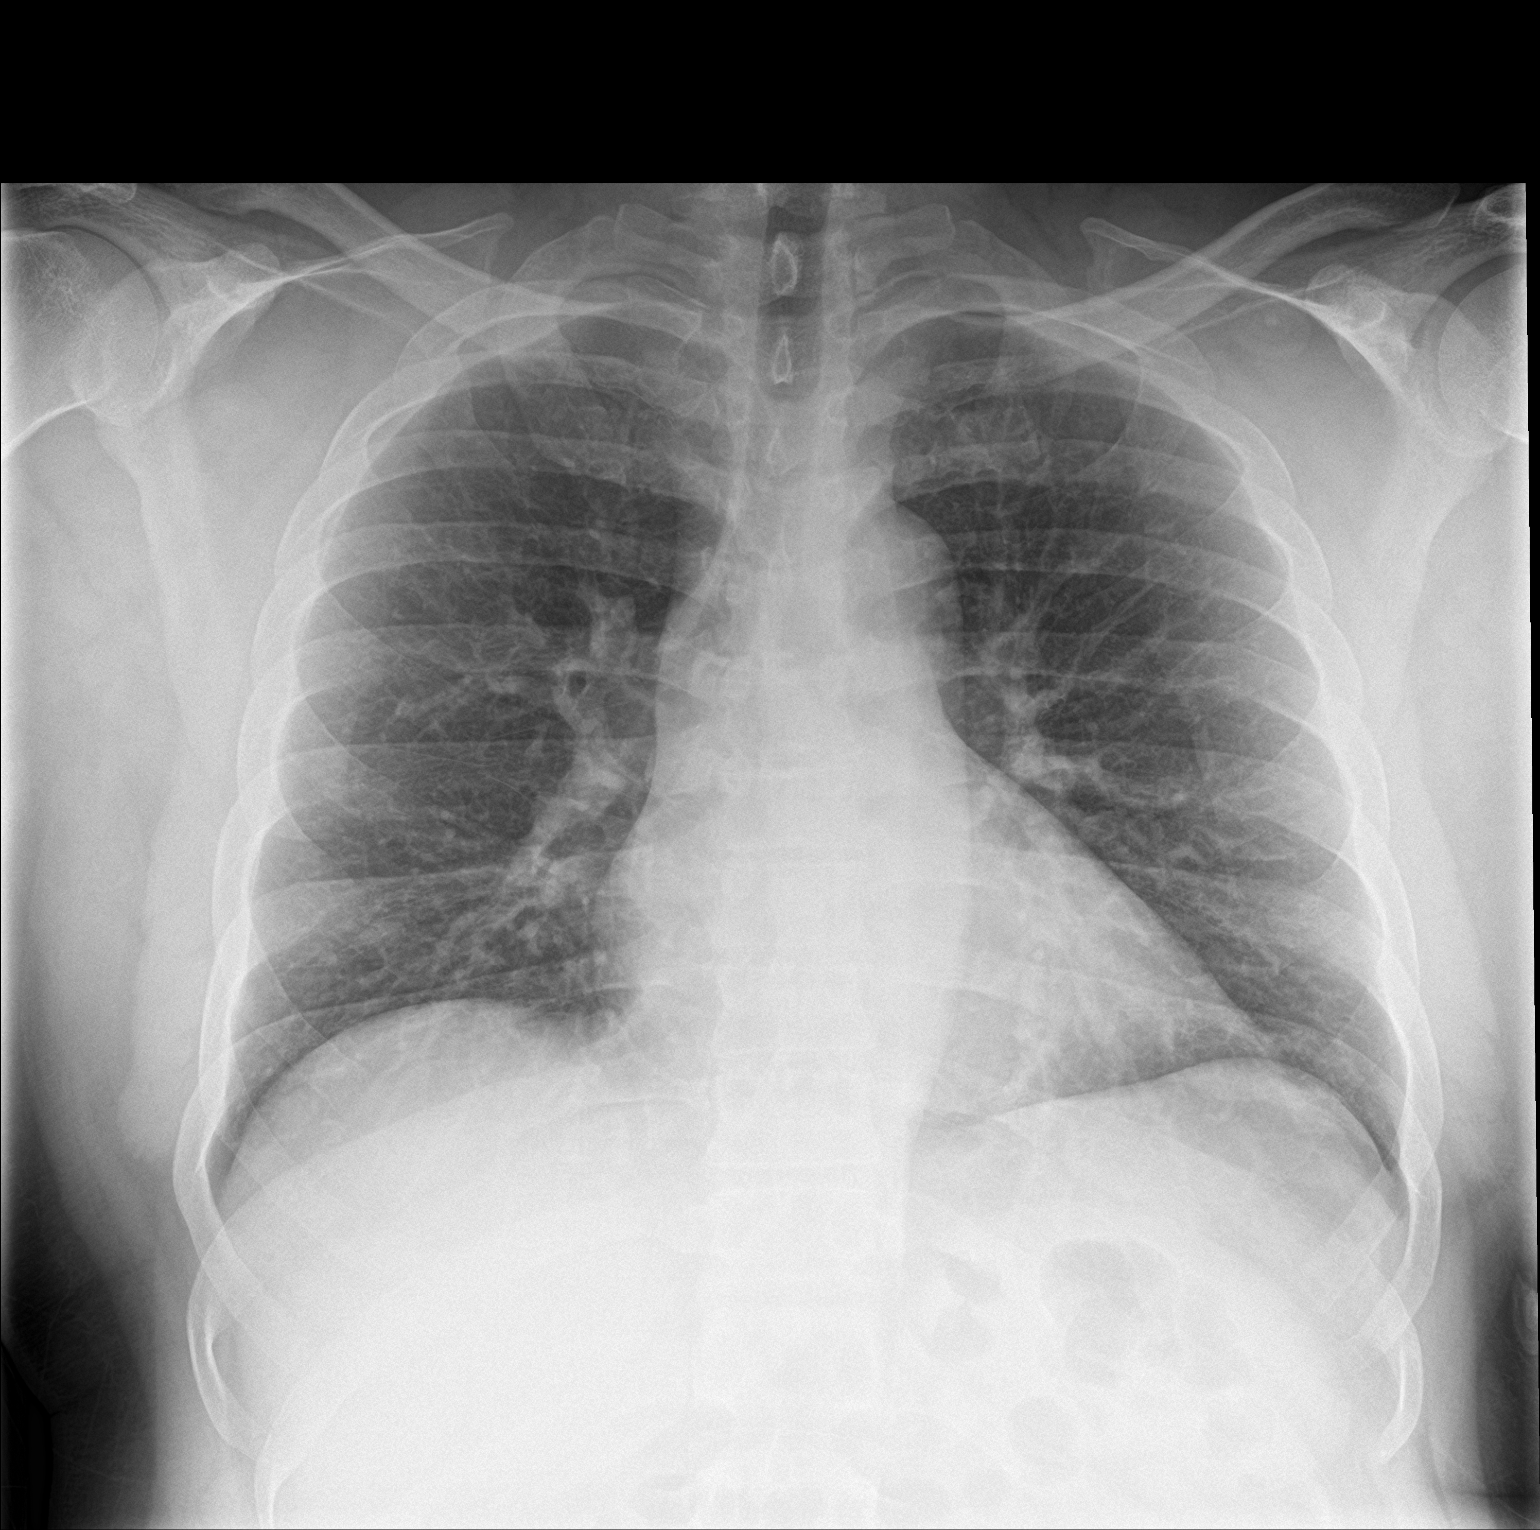

[chest lat]
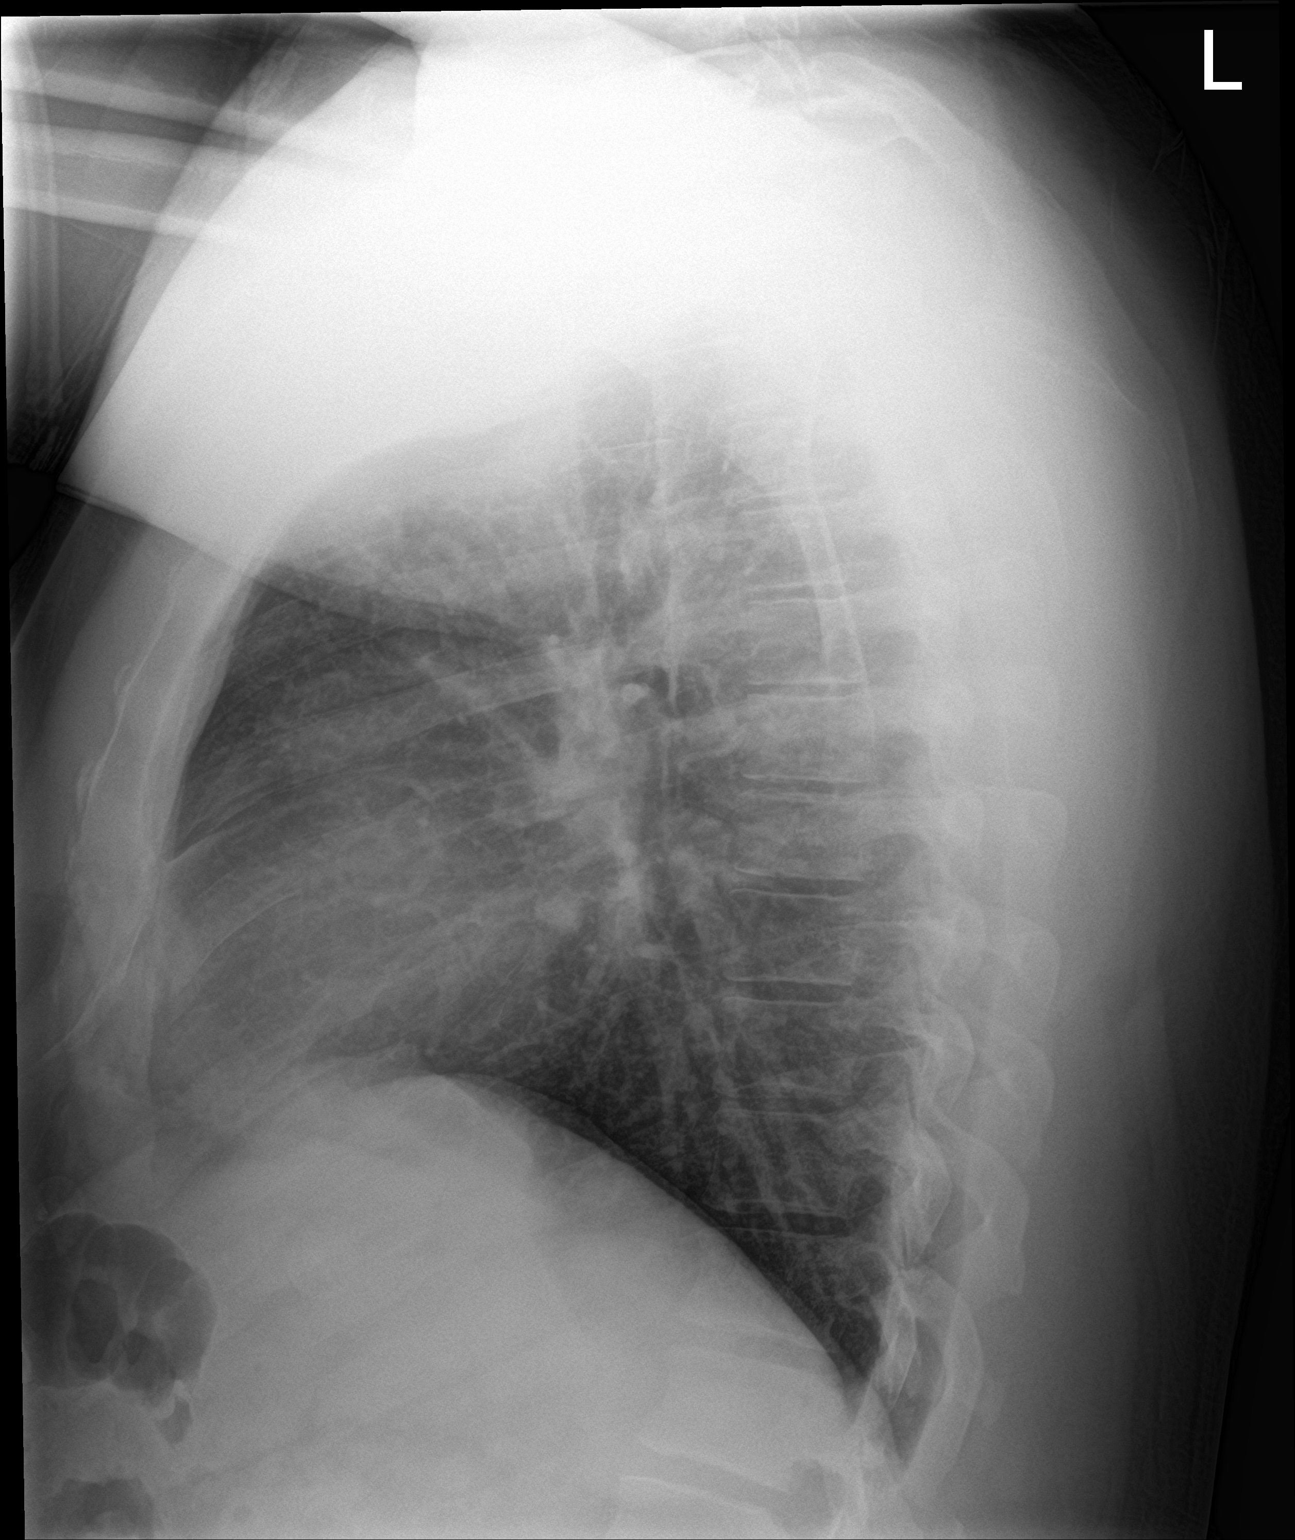

[2 of 2 positions shown; findings below may reference images not displayed]

FINDINGS: The heart size and mediastinal contours are within normal limits.
Both lungs are clear. The visualized skeletal structures are
unremarkable.
IMPRESSION: No active cardiopulmonary disease.

## 2022-10-21 ENCOUNTER — Ambulatory Visit
Admission: EM | Admit: 2022-10-21 | Discharge: 2022-10-21 | Disposition: A | Discharge status: Home / Self Care | Payer: Self-pay | Attending: Family Medicine | Admitting: Family Medicine

## 2022-10-21 DIAGNOSIS — F17210 Nicotine dependence, cigarettes, uncomplicated: Secondary | ICD-10-CM | POA: Insufficient documentation

## 2022-10-21 DIAGNOSIS — R058 Other specified cough: Secondary | ICD-10-CM | POA: Insufficient documentation

## 2022-10-21 DIAGNOSIS — I10 Essential (primary) hypertension: Secondary | ICD-10-CM | POA: Insufficient documentation

## 2022-10-21 DIAGNOSIS — R070 Pain in throat: Secondary | ICD-10-CM | POA: Insufficient documentation

## 2022-10-21 DIAGNOSIS — B9789 Other viral agents as the cause of diseases classified elsewhere: Secondary | ICD-10-CM | POA: Insufficient documentation

## 2022-10-21 DIAGNOSIS — R0981 Nasal congestion: Secondary | ICD-10-CM | POA: Insufficient documentation

## 2022-10-21 DIAGNOSIS — Z1152 Encounter for screening for COVID-19: Secondary | ICD-10-CM | POA: Insufficient documentation

## 2022-10-21 DIAGNOSIS — J069 Acute upper respiratory infection, unspecified: Secondary | ICD-10-CM | POA: Insufficient documentation

## 2022-10-21 MED ORDER — LIDOCAINE VISCOUS HCL 2 % MT SOLN
10.0000 mL | OROMUCOSAL | 0 refills | Status: AC | PRN
Start: 2022-10-21 — End: ?

## 2022-10-21 MED ORDER — PROMETHAZINE-DM 6.25-15 MG/5ML PO SYRP: 5.0000 mL | ORAL_SOLUTION | Freq: Four times a day (QID) | ORAL | 0 refills | Status: AC | PRN

## 2022-10-21 MED ORDER — FLUTICASONE PROPIONATE 50 MCG/ACT NA SUSP: 1.0000 | Freq: Two times a day (BID) | NASAL | 2 refills | Status: AC

## 2022-10-21 NOTE — ED Provider Notes (Signed)
[facility]    CSN: [id number] Arrival date & time: 10/21/22  4098      History   Chief Complaint Chief Complaint  Patient presents with   Sore Throat    HPI Allen Ramirez is a 42 y.o. male.   Presenting today with 2-day history of nasal congestion, sinus congestion, sore throat, cough.  Denies fever, chills, body aches, chest pain, shortness of breath, abdominal pain, nausea vomiting or diarrhea.  So far not trying anything over-the-counter for symptoms.  No known sick contacts recently.    Past Medical History:  Diagnosis Date   Hyperlipidemia    Hypertension     Patient Active Problem List   Diagnosis Date Noted   Nasal congestion 10/21/2022   Throat pain in adult 10/21/2022    History reviewed. No pertinent surgical history.     Home Medications    Prior to Admission medications   Medication Sig Start Date End Date Taking? Authorizing Provider  fluticasone (FLONASE) 50 MCG/ACT nasal spray Place 1 spray into both nostrils 2 (two) times daily. 10/21/22  Yes Particia Nearing, PA-C  lidocaine (XYLOCAINE) 2 % solution Use as directed 10 mLs in the mouth or throat every 3 (three) hours as needed. 10/21/22  Yes Particia Nearing, PA-C  promethazine-dextromethorphan (PROMETHAZINE-DM) 6.25-15 MG/5ML syrup Take 5 mLs by mouth 4 (four) times daily as needed. 10/21/22  Yes Particia Nearing, PA-C  acetaminophen (TYLENOL) 500 MG tablet Take 1,000 mg by mouth every 6 (six) hours as needed.    [provider]  atorvastatin (LIPITOR) 10 MG tablet Take 10 mg by mouth at bedtime. 11/21/19   [provider]  doxycycline (VIBRAMYCIN) 100 MG capsule Take 1 capsule (100 mg total) by mouth 2 (two) times daily. One po bid x 7 days 12/19/21   Dione Booze, MD  lisinopril (ZESTRIL) 40 MG tablet Take 1 tablet (40 mg total) by mouth daily. 01/13/21   Linwood Dibbles, MD  metoprolol tartrate (LOPRESSOR) 25 MG tablet Take 1 tablet (25 mg total)  by mouth 2 (two) times daily. 01/13/21   Linwood Dibbles, MD    Family History History reviewed. No pertinent family history.  Social History Social History   Tobacco Use   Smoking status: Every Day    Types: Cigarettes   Smokeless tobacco: Never  Vaping Use   Vaping status: Never Used  Substance Use Topics   Alcohol use: Yes   Drug use: No     Allergies   Patient has no known allergies.   Review of Systems Review of Systems Per HPI  Physical Exam Triage Vital Signs ED Triage Vitals  Encounter Vitals Group     BP 10/21/22 0848 (!) 169/108     Systolic BP Percentile --      Diastolic BP Percentile --      Pulse Rate 10/21/22 0848 88     Resp 10/21/22 0848 20     Temp 10/21/22 0848 99 F (37.2 C)     Temp Source 10/21/22 0848 Oral     SpO2 10/21/22 0848 94 %     Weight --      Height --      Head Circumference --      Peak Flow --      Pain Score 10/21/22 0849 0     Pain Loc --      Pain Education --      Exclude from Growth Chart --    No data  found.  Updated Vital Signs BP (!) 169/108 Comment: STATES HE DID NOT TAKE HIS bp MEDS TODay  Pulse 88   Temp 99 F (37.2 C) (Oral)   Resp 20   SpO2 94%   Visual Acuity Right Eye Distance:   Left Eye Distance:   Bilateral Distance:    Right Eye Near:   Left Eye Near:    Bilateral Near:     Physical Exam Vitals and nursing note reviewed.  Constitutional:      Appearance: He is well-developed.  HENT:     Head: Atraumatic.     Right Ear: External ear normal.     Left Ear: External ear normal.     Nose: Rhinorrhea present.     Mouth/Throat:     Pharynx: Posterior oropharyngeal erythema present. No oropharyngeal exudate.  Eyes:     Conjunctiva/sclera: Conjunctivae normal.     Pupils: Pupils are equal, round, and reactive to light.  Cardiovascular:     Rate and Rhythm: Normal rate and regular rhythm.  Pulmonary:     Effort: Pulmonary effort is normal. No respiratory distress.     Breath sounds: No  wheezing or rales.  Musculoskeletal:        General: Normal range of motion.     Cervical back: Normal range of motion and neck supple.  Lymphadenopathy:     Cervical: No cervical adenopathy.  Skin:    General: Skin is warm and dry.  Neurological:     Mental Status: He is alert and oriented to person, place, and time.  Psychiatric:        Behavior: Behavior normal.      UC Treatments / Results  Labs (all labs ordered are listed, but only abnormal results are displayed) Labs Reviewed  SARS CORONAVIRUS 2 (TAT 6-24 HRS)    EKG   Radiology No results found.  Procedures Procedures (including critical care time)  Medications Ordered in UC Medications - No data to display  Initial Impression / Assessment and Plan / UC Course  I have reviewed the triage vital signs and the nursing notes.  Pertinent labs & imaging results that were available during my care of the patient were reviewed by me and considered in my medical decision making (see chart for details).     Vitals and exam overall reassuring today, hypertensive in triage, otherwise within normal limits.  Suspect viral respiratory infection.  COVID testing pending, treat with Phenergan DM, Flonase, viscous lidocaine and supportive over-the-counter medications and home care.  Return for worsening symptoms.  Final Clinical Impressions(s) / UC Diagnoses   Final diagnoses:  Viral URI with cough   Discharge Instructions   None    ED Prescriptions     Medication Sig Dispense Auth. Provider   promethazine-dextromethorphan (PROMETHAZINE-DM) 6.25-15 MG/5ML syrup Take 5 mLs by mouth 4 (four) times daily as needed. 100 mL Particia Nearing, PA-C   fluticasone [facility]) 50 MCG/ACT nasal spray Place 1 spray into both nostrils 2 (two) times daily. 16 g Particia Nearing, PA-C   lidocaine (XYLOCAINE) 2 % solution Use as directed 10 mLs in the mouth or throat every 3 (three) hours as needed. 100 mL Particia Nearing, [place]      PDMP not reviewed this encounter.   Particia Nearing, [place] 10/21/22 1059

## 2022-10-21 NOTE — ED Triage Notes (Signed)
Pt reports he has nasal congestion, head congestion, and throat pain x 2 days.

## 2022-10-22 LAB — SARS CORONAVIRUS 2 (TAT 6-24 HRS): SARS Coronavirus 2: NEGATIVE

## 2022-10-26 ENCOUNTER — Emergency Department (HOSPITAL_COMMUNITY)
Admission: EM | Admit: 2022-10-26 | Discharge: 2022-10-26 | Disposition: A | Discharge status: Home / Self Care | Payer: Self-pay | Attending: Emergency Medicine | Admitting: Emergency Medicine

## 2022-10-26 ENCOUNTER — Other Ambulatory Visit: Payer: Self-pay

## 2022-10-26 ENCOUNTER — Encounter (HOSPITAL_COMMUNITY): Payer: Self-pay | Admitting: Emergency Medicine

## 2022-10-26 DIAGNOSIS — J40 Bronchitis, not specified as acute or chronic: Secondary | ICD-10-CM | POA: Insufficient documentation

## 2022-10-26 MED ORDER — AMOXICILLIN-POT CLAVULANATE 875-125 MG PO TABS
1.0000 | ORAL_TABLET | Freq: Once | ORAL | Status: AC
  Administered 2022-10-26: 1 via ORAL
  Filled 2022-10-26: qty 1

## 2022-10-26 MED ORDER — AMOXICILLIN-POT CLAVULANATE 875-125 MG PO TABS: 1.0000 | ORAL_TABLET | Freq: Two times a day (BID) | ORAL | 0 refills | Status: AC

## 2022-10-26 MED ORDER — PREDNISONE 10 MG PO TABS
60.0000 mg | ORAL_TABLET | Freq: Once | ORAL | Status: AC
  Administered 2022-10-26: 60 mg via ORAL
  Filled 2022-10-26: qty 1

## 2022-10-26 MED ORDER — PREDNISONE 20 MG PO TABS: 40.0000 mg | ORAL_TABLET | Freq: Every day | ORAL | 0 refills | Status: AC

## 2022-10-26 MED ORDER — HYDROCODONE BIT-HOMATROP MBR 5-1.5 MG/5ML PO SOLN
5.0000 mL | ORAL | Status: AC
  Administered 2022-10-26: 5 mL via ORAL
  Filled 2022-10-26: qty 5

## 2022-10-26 MED ORDER — ALBUTEROL SULFATE HFA 108 (90 BASE) MCG/ACT IN AERS
2.0000 | INHALATION_SPRAY | RESPIRATORY_TRACT | Status: DC | PRN
  Administered 2022-10-26: 2 via RESPIRATORY_TRACT
  Filled 2022-10-26: qty 6.7

## 2022-10-26 NOTE — ED Provider Notes (Signed)
[place] [facility]GENCY DEPARTMENT AT [facility] Provider Note   CSN: [id number] Arrival date & time: 10/26/22  0118     History  No chief complaint on file.   Allen Ramirez is a 42 y.o. male.  Patient presents to the emergency department for evaluation of persistent URI symptoms.  Symptoms began 5 days ago.  He was seen at urgent care and placed on symptomatic treatment.  Patient concerned because he is still experiencing cough, sinus drainage.  He reports that his nose is stuffy and this is preventing him from using his nasal CPAP.  He has been waking up throughout the night because of cough.  Blood pressure has been up.       Home Medications Prior to Admission medications   Medication Sig Start Date End Date Taking? Authorizing Provider  acetaminophen (TYLENOL) 500 MG tablet Take 1,000 mg by mouth every 6 (six) hours as needed.    [provider]  amoxicillin-clavulanate (AUGMENTIN) 875-125 MG tablet Take 1 tablet by mouth every 12 (twelve) hours. 10/26/22  Yes Kenichi Cassada, Canary Brim, MD  atorvastatin (LIPITOR) 10 MG tablet Take 10 mg by mouth at bedtime. 11/21/19   [provider]  fluticasone (FLONASE) 50 MCG/ACT nasal spray Place 1 spray into both nostrils 2 (two) times daily. 10/21/22   Particia Nearing, PA-C  lidocaine (XYLOCAINE) 2 % solution Use as directed 10 mLs in the mouth or throat every 3 (three) hours as needed. 10/21/22   Particia Nearing, PA-C  lisinopril (ZESTRIL) 40 MG tablet Take 1 tablet (40 mg total) by mouth daily. 01/13/21   Linwood Dibbles, MD  metoprolol tartrate (LOPRESSOR) 25 MG tablet Take 1 tablet (25 mg total) by mouth 2 (two) times daily. 01/13/21   Linwood Dibbles, MD  predniSONE (DELTASONE) 20 MG tablet Take 2 tablets (40 mg total) by mouth daily with breakfast. 10/26/22  Yes Trayvon Trumbull, Canary Brim, MD  promethazine-dextromethorphan (PROMETHAZINE-DM) 6.25-15 MG/5ML syrup Take 5 mLs by mouth 4 (four) times daily as  needed. 10/21/22   Particia Nearing, PA-C      Allergies    Patient has no known allergies.    Review of Systems   Review of Systems  Physical Exam Updated Vital Signs BP (!) 187/107   Pulse 96   Temp 98.2 F (36.8 C) (Oral)   Resp (!) 21   Ht 6' (1.829 m)   Wt (!) 148 kg   SpO2 98%   BMI 44.25 kg/m  Physical Exam Vitals and nursing note reviewed.  Constitutional:      General: He is not in acute distress.    Appearance: He is well-developed.  HENT:     Head: Normocephalic and atraumatic.     Mouth/Throat:     Mouth: Mucous membranes are moist.  Eyes:     General: Vision grossly intact. Gaze aligned appropriately.     Extraocular Movements: Extraocular movements intact.     Conjunctiva/sclera: Conjunctivae normal.  Cardiovascular:     Rate and Rhythm: Normal rate and regular rhythm.     Pulses: Normal pulses.     Heart sounds: Normal heart sounds, S1 normal and S2 normal. No murmur heard.    No friction rub. No gallop.  Pulmonary:     Effort: Pulmonary effort is normal. No respiratory distress.     Breath sounds: Normal breath sounds.  Abdominal:     Palpations: Abdomen is soft.     Tenderness: There is no abdominal tenderness. There is  no guarding or rebound.     Hernia: No hernia is present.  Musculoskeletal:        General: No swelling.     Cervical back: Full passive range of motion without pain, normal range of motion and neck supple. No pain with movement, spinous process tenderness or muscular tenderness. Normal range of motion.     Right lower leg: No edema.     Left lower leg: No edema.  Skin:    General: Skin is warm and dry.     Capillary Refill: Capillary refill takes less than 2 seconds.     Findings: No ecchymosis, erythema, lesion or wound.  Neurological:     Mental Status: He is alert and oriented to person, place, and time.     GCS: GCS eye subscore is 4. GCS verbal subscore is 5. GCS motor subscore is 6.     Cranial Nerves: Cranial  nerves 2-12 are intact.     Sensory: Sensation is intact.     Motor: Motor function is intact. No weakness or abnormal muscle tone.     Coordination: Coordination is intact.  Psychiatric:        Mood and Affect: Mood normal.        Speech: Speech normal.        Behavior: Behavior normal.     ED Results / Procedures / Treatments   Labs (all labs ordered are listed, but only abnormal results are displayed) Labs Reviewed - No data to display  EKG None  Radiology No results found.  Procedures Procedures    Medications Ordered in ED Medications  albuterol (VENTOLIN HFA) 108 (90 Base) MCG/ACT inhaler 2 puff (has no administration in time range)  predniSONE (DELTASONE) tablet 60 mg (has no administration in time range)  amoxicillin-clavulanate (AUGMENTIN) 875-125 MG per tablet 1 tablet (has no administration in time range)    ED Course/ Medical Decision Making/ A&P                                 Medical Decision Making  Patient with nasal congestion and URI symptoms nearly a week.  Not responding to OTC meds.  Concerned about his blood pressure.  He has not been taking any decongestants.  He did not take his blood pressure medicine today.  Clinical concern for pneumonia.  Oxygenating well.  No fever.  Does not appear toxic or septic.        Final Clinical Impression(s) / ED Diagnoses Final diagnoses:  Bronchitis    Rx / DC Orders ED Discharge Orders          Ordered    amoxicillin-clavulanate (AUGMENTIN) 875-125 MG tablet  Every 12 hours        10/26/22 0154    predniSONE (DELTASONE) 20 MG tablet  Daily with breakfast        10/26/22 0154              Gilda Crease, MD 10/26/22 [phone]

## 2022-10-26 NOTE — ED Triage Notes (Addendum)
Pt to ed pov, c/o of cold symptoms that started a week ago that has progressed. Pt was seen at [facility] and dx with URI and spouse states that he is still "sick". Pt ambulatory, NAD Pt states has not taken BP medication tonight d/t not knowing what all pt can take

## 2023-01-16 ENCOUNTER — Other Ambulatory Visit: Payer: Self-pay | Admitting: Family Medicine

## 2023-06-19 IMAGING — DX DG CHEST 1V PORT
1 series · 1 of 1 positions shown · non-contrast
Comparison: 02/15/2020

CLINICAL DATA: New onset hypertension for 2 days, initial encounter

EXAM:
PORTABLE CHEST 1 VIEW

[chest ap]
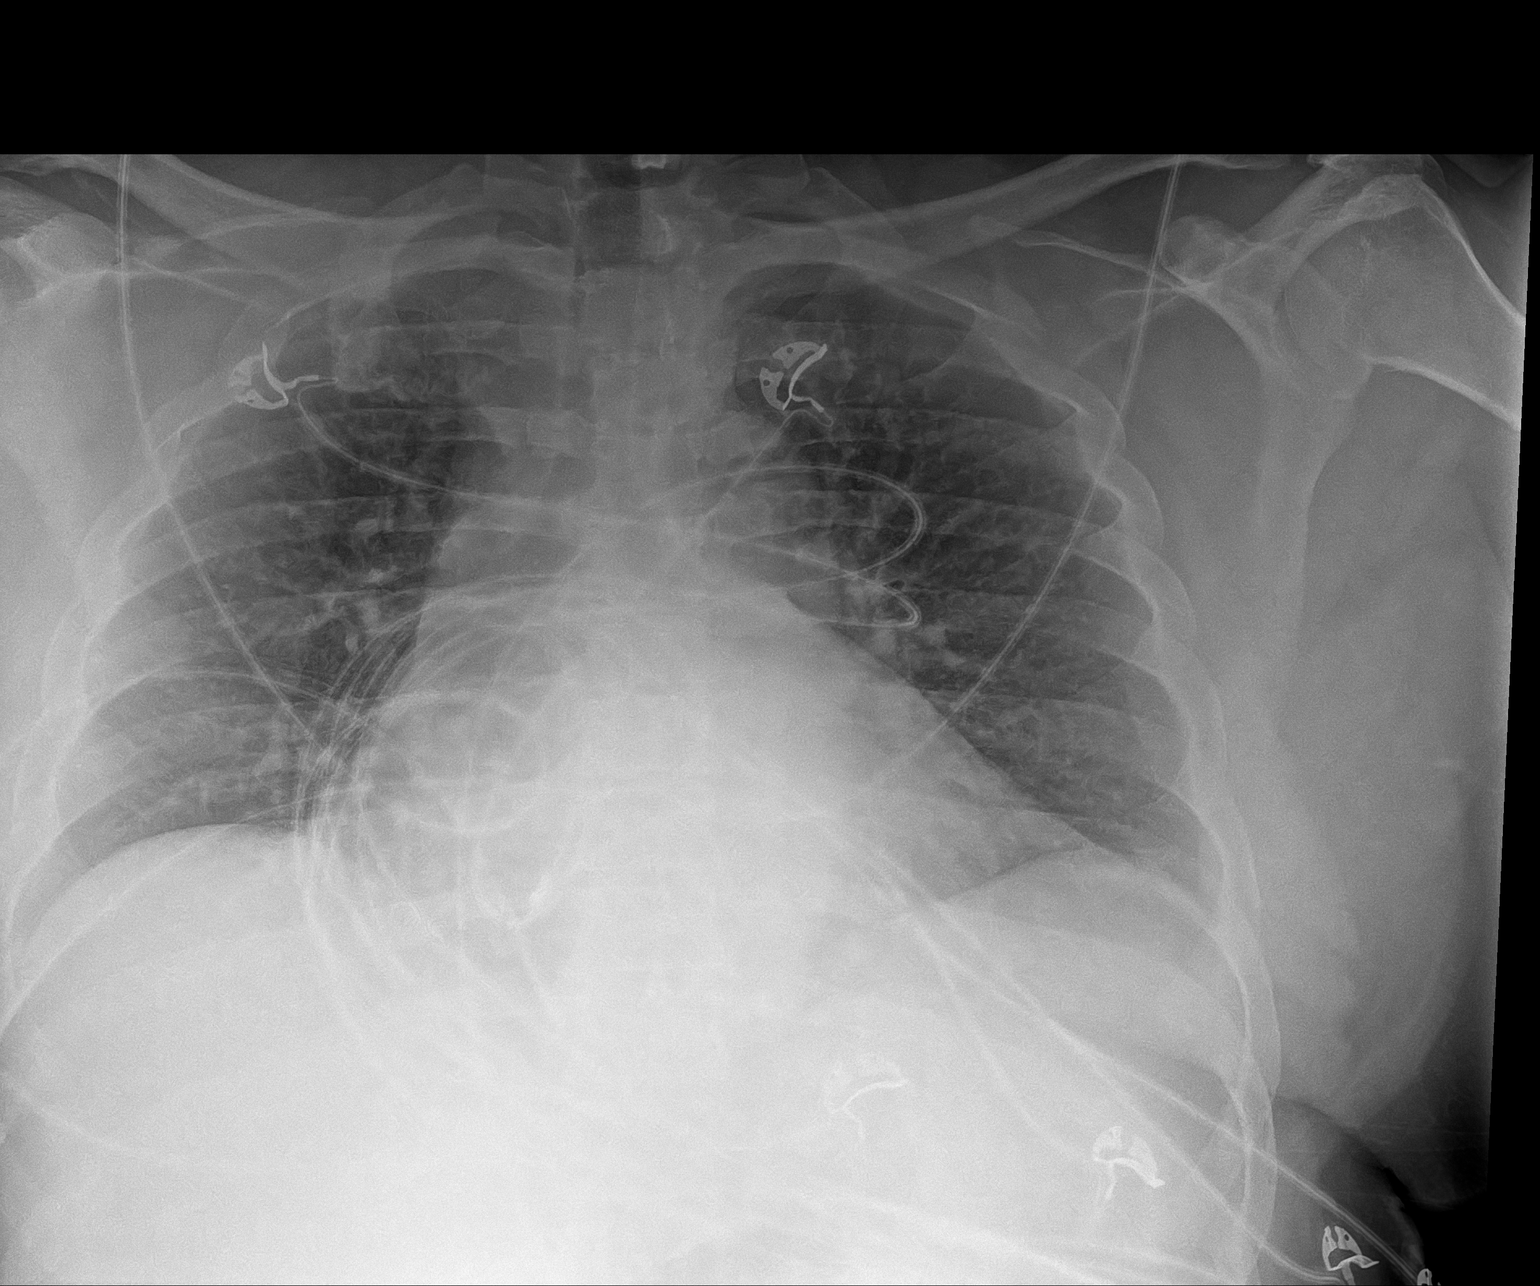

[1 of 1 positions shown; findings below may reference images not displayed]

FINDINGS: Cardiac shadow is stable. Lungs are well aerated bilaterally. No
bony abnormality is seen.
IMPRESSION: No active disease.

## 2023-11-20 ENCOUNTER — Ambulatory Visit
Admission: EM | Admit: 2023-11-20 | Discharge: 2023-11-20 | Disposition: A | Discharge status: Home / Self Care | Attending: Family Medicine | Admitting: Family Medicine

## 2023-11-20 DIAGNOSIS — R030 Elevated blood-pressure reading, without diagnosis of hypertension: Secondary | ICD-10-CM

## 2023-11-20 DIAGNOSIS — J069 Acute upper respiratory infection, unspecified: Secondary | ICD-10-CM

## 2023-11-20 MED ORDER — PROMETHAZINE-DM 6.25-15 MG/5ML PO SYRP: 5.0000 mL | ORAL_SOLUTION | Freq: Four times a day (QID) | ORAL | 0 refills | Status: AC | PRN

## 2023-11-20 MED ORDER — AZELASTINE HCL 0.1 % NA SOLN: 1.0000 | Freq: Two times a day (BID) | NASAL | 0 refills | Status: AC

## 2023-11-20 NOTE — Discharge Instructions (Signed)
 In addition to the scribed medications, you may take Coricidin HBP, plain Mucinex, saline sinus rinses, Tylenol, use humidifiers, drink plenty of fluids and get lots of rest.  Follow-up for worsening or unresolving symptoms.

## 2023-11-20 NOTE — ED Triage Notes (Signed)
 Pt being seen in UC for cough and nasal congestion. Pt denies fever, n/v/d. Pt reports s/s started approx 3 days ago.

## 2023-11-20 NOTE — ED Provider Notes (Signed)
 [facility]    CSN: [id number] Arrival date & time: 11/20/23  1517      History   Chief Complaint Chief Complaint  Patient presents with   Cough   Nasal Congestion    HPI Allen Ramirez is a 43 y.o. male.   Patient presenting today with 3-day history of cough, congestion, fatigue.  Denies fever, chills, chest pain, shortness of breath, abdominal pain, vomiting, diarrhea.  So far not trying anything over-the-counter for symptoms.  No known history of chronic pulmonary disease.    Past Medical History:  Diagnosis Date   Hyperlipidemia    Hypertension     Patient Active Problem List   Diagnosis Date Noted   Nasal congestion 10/21/2022   Throat pain in adult 10/21/2022    History reviewed. No pertinent surgical history.     Home Medications    Prior to Admission medications   Medication Sig Start Date End Date Taking? Authorizing Provider  azelastine (ASTELIN) 0.1 % nasal spray Place 1 spray into both nostrils 2 (two) times daily. Use in each nostril as directed 11/20/23  Yes Stuart Vernell Norris, PA-C  promethazine -dextromethorphan (PROMETHAZINE -DM) 6.25-15 MG/5ML syrup Take 5 mLs by mouth 4 (four) times daily as needed. 11/20/23  Yes Stuart Vernell Norris, PA-C  acetaminophen (TYLENOL) 500 MG tablet Take 1,000 mg by mouth every 6 (six) hours as needed.    [provider]  amoxicillin -clavulanate (AUGMENTIN ) 875-125 MG tablet Take 1 tablet by mouth every 12 (twelve) hours. Patient not taking: Reported on 11/20/2023 10/26/22   Haze Lonni PARAS, MD  atorvastatin (LIPITOR) 10 MG tablet Take 10 mg by mouth at bedtime. Patient not taking: Reported on 11/20/2023 11/21/19   [provider]  fluticasone  (FLONASE ) 50 MCG/ACT nasal spray Place 1 spray into both nostrils 2 (two) times daily. 10/21/22   Stuart Vernell Norris, PA-C  lidocaine  (XYLOCAINE ) 2 % solution Use as directed 10 mLs in the mouth or throat every 3 (three) hours  as needed. 10/21/22   Stuart Vernell Norris, PA-C  lisinopril  (ZESTRIL ) 40 MG tablet Take 1 tablet (40 mg total) by mouth daily. 01/13/21   Randol Simmonds, MD  metoprolol  tartrate (LOPRESSOR ) 25 MG tablet Take 1 tablet (25 mg total) by mouth 2 (two) times daily. 01/13/21   Randol Simmonds, MD  predniSONE  (DELTASONE ) 20 MG tablet Take 2 tablets (40 mg total) by mouth daily with breakfast. Patient not taking: Reported on 11/20/2023 10/26/22   Haze Lonni PARAS, MD  promethazine -dextromethorphan (PROMETHAZINE -DM) 6.25-15 MG/5ML syrup Take 5 mLs by mouth 4 (four) times daily as needed. Patient not taking: Reported on 11/20/2023 10/21/22   Stuart Vernell Norris, PA-C    Family History History reviewed. No pertinent family history.  Social History Social History   Tobacco Use   Smoking status: Every Day    Types: Cigarettes   Smokeless tobacco: Never  Vaping Use   Vaping status: Never Used  Substance Use Topics   Alcohol use: Yes   Drug use: No     Allergies   Patient has no known allergies.   Review of Systems Review of Systems PER HPI  Physical Exam Triage Vital Signs ED Triage Vitals  Encounter Vitals Group     BP 11/20/23 1709 (!) 147/93     Girls Systolic BP Percentile --      Girls Diastolic BP Percentile --      Boys Systolic BP Percentile --      Boys Diastolic BP Percentile --  Pulse Rate 11/20/23 1709 73     Resp 11/20/23 1709 18     Temp 11/20/23 1709 97.8 F (36.6 C)     Temp Source 11/20/23 1709 Oral     SpO2 11/20/23 1709 97 %     Weight --      Height --      Head Circumference --      Peak Flow --      Pain Score 11/20/23 1710 0     Pain Loc --      Pain Education --      Exclude from Growth Chart --    No data found.  Updated Vital Signs BP (!) 147/93 (BP Location: Right Arm)   Pulse 73   Temp 97.8 F (36.6 C) (Oral)   Resp 18   SpO2 97%   Visual Acuity Right Eye Distance:   Left Eye Distance:   Bilateral Distance:    Right Eye  Near:   Left Eye Near:    Bilateral Near:     Physical Exam Vitals and nursing note reviewed.  Constitutional:      Appearance: He is well-developed.  HENT:     Head: Atraumatic.     Right Ear: External ear normal.     Left Ear: External ear normal.     Nose: Rhinorrhea present.     Mouth/Throat:     Pharynx: Posterior oropharyngeal erythema present. No oropharyngeal exudate.  Eyes:     Conjunctiva/sclera: Conjunctivae normal.     Pupils: Pupils are equal, round, and reactive to light.  Cardiovascular:     Rate and Rhythm: Normal rate and regular rhythm.  Pulmonary:     Effort: Pulmonary effort is normal. No respiratory distress.     Breath sounds: No wheezing or rales.  Musculoskeletal:        General: Normal range of motion.     Cervical back: Normal range of motion and neck supple.  Lymphadenopathy:     Cervical: No cervical adenopathy.  Skin:    General: Skin is warm and dry.  Neurological:     Mental Status: He is alert and oriented to person, place, and time.  Psychiatric:        Behavior: Behavior normal.      UC Treatments / Results  Labs (all labs ordered are listed, but only abnormal results are displayed) Labs Reviewed - No data to display  EKG   Radiology No results found.  Procedures Procedures (including critical care time)  Medications Ordered in UC Medications - No data to display  Initial Impression / Assessment and Plan / UC Course  I have reviewed the triage vital signs and the nursing notes.  Pertinent labs & imaging results that were available during my care of the patient were reviewed by me and considered in my medical decision making (see chart for details).     Hypertensive in triage otherwise vital signs within normal limits.  He is overall well-appearing and in no acute distress.  Suspect viral respiratory infection.  Continue monitoring home blood pressures and follow-up if not resolving, list of safe medications  over-the-counter for symptoms reviewed with patient that will not affect his blood pressure.  Phenergan  DM, Astelin, supportive home care and return precautions reviewed.  Final Clinical Impressions(s) / UC Diagnoses   Final diagnoses:  Viral URI with cough  Elevated blood pressure reading     Discharge Instructions      In addition to the scribed medications, you  may take Coricidin HBP, plain Mucinex, saline sinus rinses, Tylenol, use humidifiers, drink plenty of fluids and get lots of rest.  Follow-up for worsening or unresolving symptoms.    ED Prescriptions     Medication Sig Dispense Auth. Provider   promethazine -dextromethorphan (PROMETHAZINE -DM) 6.25-15 MG/5ML syrup Take 5 mLs by mouth 4 (four) times daily as needed. 100 mL Stuart Vernell Norris, PA-C   azelastine (ASTELIN) 0.1 % nasal spray Place 1 spray into both nostrils 2 (two) times daily. Use in each nostril as directed 30 mL Stuart Vernell Norris, PA-C      PDMP not reviewed this encounter.   Stuart Vernell Norris, [place] 11/20/23 1736

## 2023-12-12 ENCOUNTER — Other Ambulatory Visit: Payer: Self-pay | Admitting: Family Medicine

## 2024-07-17 ENCOUNTER — Ambulatory Visit: Payer: Self-pay
